# Patient Record
Sex: Male | Born: 1985 | Race: Black or African American | Hispanic: No | Marital: Single | Smoking: Current some day smoker
Health system: Southern US, Community
[De-identification: ages and names within clinical notes are randomized; demographics above are authoritative.]

## PROBLEM LIST (undated history)

## (undated) DIAGNOSIS — Z21 Asymptomatic human immunodeficiency virus [HIV] infection status: Secondary | ICD-10-CM

## (undated) DIAGNOSIS — R569 Unspecified convulsions: Secondary | ICD-10-CM

## (undated) DIAGNOSIS — B20 Human immunodeficiency virus [HIV] disease: Secondary | ICD-10-CM

## (undated) DIAGNOSIS — J45909 Unspecified asthma, uncomplicated: Secondary | ICD-10-CM

## (undated) HISTORY — PX: KNEE SURGERY: SHX244

---

## 2015-12-23 ENCOUNTER — Emergency Department (HOSPITAL_COMMUNITY)
Admission: EM | Admit: 2015-12-23 | Discharge: 2015-12-23 | Disposition: A | Discharge status: Home / Self Care | Payer: Self-pay | Attending: Emergency Medicine | Admitting: Emergency Medicine

## 2015-12-23 ENCOUNTER — Emergency Department (HOSPITAL_COMMUNITY): Payer: Self-pay

## 2015-12-23 ENCOUNTER — Encounter (HOSPITAL_COMMUNITY): Payer: Self-pay | Admitting: Emergency Medicine

## 2015-12-23 DIAGNOSIS — M62830 Muscle spasm of back: Secondary | ICD-10-CM | POA: Insufficient documentation

## 2015-12-23 DIAGNOSIS — J45909 Unspecified asthma, uncomplicated: Secondary | ICD-10-CM | POA: Insufficient documentation

## 2015-12-23 DIAGNOSIS — S46812A Strain of other muscles, fascia and tendons at shoulder and upper arm level, left arm, initial encounter: Secondary | ICD-10-CM

## 2015-12-23 DIAGNOSIS — Y998 Other external cause status: Secondary | ICD-10-CM | POA: Insufficient documentation

## 2015-12-23 DIAGNOSIS — T148XXA Other injury of unspecified body region, initial encounter: Secondary | ICD-10-CM

## 2015-12-23 DIAGNOSIS — X58XXXA Exposure to other specified factors, initial encounter: Secondary | ICD-10-CM | POA: Insufficient documentation

## 2015-12-23 DIAGNOSIS — S29012A Strain of muscle and tendon of back wall of thorax, initial encounter: Secondary | ICD-10-CM | POA: Insufficient documentation

## 2015-12-23 DIAGNOSIS — S199XXA Unspecified injury of neck, initial encounter: Secondary | ICD-10-CM | POA: Insufficient documentation

## 2015-12-23 DIAGNOSIS — Y9389 Activity, other specified: Secondary | ICD-10-CM | POA: Insufficient documentation

## 2015-12-23 DIAGNOSIS — M25512 Pain in left shoulder: Secondary | ICD-10-CM

## 2015-12-23 DIAGNOSIS — X503XXA Overexertion from repetitive movements, initial encounter: Secondary | ICD-10-CM

## 2015-12-23 DIAGNOSIS — S4992XA Unspecified injury of left shoulder and upper arm, initial encounter: Secondary | ICD-10-CM | POA: Insufficient documentation

## 2015-12-23 DIAGNOSIS — Y9289 Other specified places as the place of occurrence of the external cause: Secondary | ICD-10-CM | POA: Insufficient documentation

## 2015-12-23 HISTORY — DX: Unspecified asthma, uncomplicated: J45.909

## 2015-12-23 HISTORY — DX: Unspecified convulsions: R56.9

## 2015-12-23 MED ORDER — NAPROXEN 500 MG PO TABS
500.0000 mg | ORAL_TABLET | Freq: Two times a day (BID) | ORAL | Status: DC | PRN
Start: 2015-12-23 — End: 2016-01-05

## 2015-12-23 MED ORDER — KETOROLAC TROMETHAMINE 30 MG/ML IJ SOLN
60.0000 mg | Freq: Once | INTRAMUSCULAR | Status: AC
  Administered 2015-12-23: 60 mg via INTRAMUSCULAR
  Filled 2015-12-23: qty 2

## 2015-12-23 MED ORDER — CYCLOBENZAPRINE HCL 10 MG PO TABS: 10.0000 mg | ORAL_TABLET | Freq: Three times a day (TID) | ORAL | Status: DC | PRN

## 2015-12-23 NOTE — ED Provider Notes (Signed)
CSN: [id number]     Arrival date & time 12/23/15  0907 History   First MD Initiated Contact with Patient 12/23/15 [phone]     Chief Complaint  Patient presents with  . Shoulder Pain     (Consider location/radiation/quality/duration/timing/severity/associated sxs/prior Treatment) HPI Comments: Lee Austin is a 30 y.o. male with a PMHx of seizures and asthma, who presents to the ED with complaints of left shoulder/trapezius pain. 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 Describes gradual onset 5/10 stabbing/sharp nonradiating left trapezius/shoulder pain which is constant, worse with pushing and pulling activities, unrelieved with ibuprofen, and improved somewhat with Biofreeze. He denies any other injuries. No falls or trauma. He denies any shoulder swelling/erythema/warmth/bruising, loss of ROM, fevers, chills, CP, SOB, abd pain, N/V/D/C, hematuria, dysuria, numbness, tingling, weakness, or skin changes.   Patient is a 30 y.o. male presenting with shoulder pain. The history is provided by the patient. No language interpreter was used.  Shoulder Pain Location:  Shoulder Time since incident:  2 days Injury: no   Shoulder location:  L shoulder Pain details:    Quality:  Sharp   Radiates to:  Does not radiate   Severity:  Moderate   Onset quality:  Gradual   Duration:  2 days   Timing:  Constant   Progression:  Unchanged Chronicity:  New Dislocation: no   Relieved by: biofreeze. Worsened by:  Movement Ineffective treatments:  NSAIDs Associated symptoms: neck pain (L trapezius)   Associated symptoms: no decreased range of motion, no fever, no muscle weakness, no numbness, no swelling and no tingling     Past Medical History  Diagnosis Date  . Seizures (HCC)   . Asthma    History reviewed. No pertinent past surgical history. History reviewed.  No pertinent family history. Social History  Substance Use Topics  . Smoking status: Never Smoker   . Smokeless tobacco: None  . Alcohol Use: No    Review of Systems  Constitutional: Negative for fever and chills.  Respiratory: Negative for shortness of breath.   Cardiovascular: Negative for chest pain.  Gastrointestinal: Negative for nausea, vomiting, abdominal pain, diarrhea and constipation.  Genitourinary: Negative for dysuria and hematuria.  Musculoskeletal: Positive for arthralgias (L shoulder) and neck pain (L trapezius). Negative for myalgias and joint swelling.  Skin: Negative for color change.  Allergic/Immunologic: Negative for immunocompromised state.  Neurological: Negative for weakness and numbness.  Psychiatric/Behavioral: Negative for confusion.   10 Systems reviewed and are negative for acute change except as noted in the HPI.    Allergies  Review of patient's allergies indicates no known allergies.  Home Medications   Prior to Admission medications   Medication Sig Start Date End Date Taking? Authorizing Provider  cyclobenzaprine (FLEXERIL) 10 MG tablet Take 1 tablet (10 mg total) by mouth 3 (three) times daily as needed for muscle spasms. 12/23/15   Makiya Jeune Camprubi-Soms, PA-C  naproxen (NAPROSYN) 500 MG tablet Take 1 tablet (500 mg total) by mouth 2 (two) times daily as needed for mild pain, moderate pain or headache (TAKE WITH MEALS.). 12/23/15   Kaytie Ratcliffe Camprubi-Soms, PA-C   BP 114/71 mmHg  Pulse 73  Temp(Src) 98 F (36.7 C) (Oral)  Resp 20  SpO2 100% Physical Exam  Constitutional: He is oriented to person, place, and time. Vital signs are normal. He appears well-developed and  well-nourished.  Non-toxic appearance. No distress.  Afebrile, nontoxic, NAD  HENT:  Head: Normocephalic and atraumatic.  Mouth/Throat: Mucous membranes are normal.  Eyes: Conjunctivae and EOM are normal. Right eye exhibits no discharge. Left eye exhibits no discharge.  Neck:  Normal range of motion. Neck supple. Muscular tenderness present. No spinous process tenderness present. No rigidity. Normal range of motion present.    FROM intact without spinous process TTP, no bony stepoffs or deformities, with mild L sided paraspinous muscle/trapezius TTP and muscle spasms. No rigidity or meningeal signs. No bruising or swelling.   Cardiovascular: Normal rate and intact distal pulses.   Pulmonary/Chest: Effort normal. No respiratory distress.  Abdominal: Normal appearance. He exhibits no distension.  Musculoskeletal: Normal range of motion.       Left shoulder: He exhibits tenderness and spasm. He exhibits normal range of motion, no bony tenderness, no swelling, no crepitus, no deformity, normal pulse and normal strength.       Arms: L shoulder with FROM intact, no bony TTP, with mild L sided parascapular muscle TTP and spasms, no swelling/effusion, no bruising or warmth, no crepitus/deformity, negative apley scratch, neg pain with resisted int/ext rotation, neg empty can test. Strength and sensation grossly intact in all extremities, distal pulses intact.    Neurological: He is alert and oriented to person, place, and time. He has normal strength. No sensory deficit.  Skin: Skin is warm, dry and intact. No rash noted.  Psychiatric: He has a normal mood and affect.  Nursing note and vitals reviewed.   ED Course  Procedures (including critical care time) Labs Review Labs Reviewed - No data to display  Imaging Review No results found. I have personally reviewed and evaluated these images and lab results as part of my medical decision-making.   EKG Interpretation None      MDM   Final diagnoses:  Trapezius strain, left, initial encounter  Muscle spasm of back  Repetitive motion injury  Left shoulder pain    30 y.o. male here with repetitive motion injury and L trapezius strain. NVI with soft compartments, ROM preserved. No bony tenderness, doubt need for  imaging, therefore this was cancelled. Will give toradol here. Will send home with flexeril and naprosyn. Discussed use of heat. F/up with [facility] in 1wk to establish care and recheck. I explained the diagnosis and have given explicit precautions to return to the ER including for any other new or worsening symptoms. The patient understands and accepts the medical plan as it's been dictated and I have answered their questions. Discharge instructions concerning home care and prescriptions have been given. The patient is STABLE and is discharged to home in good condition.  BP 114/71 mmHg  Pulse 73  Temp(Src) 98 F (36.7 C) (Oral)  Resp 20  SpO2 100%  Meds ordered this encounter  Medications  . ketorolac (TORADOL) 30 MG/ML injection 60 mg    Sig:   . cyclobenzaprine (FLEXERIL) 10 MG tablet    Sig: Take 1 tablet (10 mg total) by mouth 3 (three) times daily as needed for muscle spasms.    Dispense:  15 tablet    Refill:  0    Order Specific Question:  Supervising Provider    Answer:  MILLER, BRIAN [[id number]]  . naproxen (NAPROSYN) 500 MG tablet    Sig: Take 1 tablet (500 mg total) by mouth 2 (two) times daily as needed for mild pain, moderate pain or headache (TAKE WITH MEALS.).    Dispense:  20  tablet    Refill:  0    Order Specific Question:  Supervising Provider    Answer:  Eber Hong [address] Camprubi-Soms, PA-C 12/23/15 [address] Bonneau, PA-C 12/23/15 1009  Pricilla Loveless, MD 12/25/15 [id number]

## 2015-12-23 NOTE — Discharge Instructions (Signed)
Perform gentle range of motion exercises daily to keep your shoulder moving. Use heat to your shoulder throughout the day, using a heat pack for 20 minutes at a time every hour. Alternate between naprosyn and tylenol for pain relief. Use flexeril as needed for muscle spasms. Do not drive or operate machinery with muscle relaxant use. Follow up with [facility] in 1 week for recheck and to establish care. Return to the ER for changes or worsening symptoms.    Shoulder Pain The shoulder is the joint that connects your arms to your body. The bones that form the shoulder joint include the upper arm bone (humerus), the shoulder blade (scapula), and the collarbone (clavicle). The top of the humerus is shaped like a ball and fits into a rather flat socket on the scapula (glenoid cavity). A combination of muscles and strong, fibrous tissues that connect muscles to bones (tendons) support your shoulder joint and hold the ball in the socket. Small, fluid-filled sacs (bursae) are located in different areas of the joint. They act as cushions between the bones and the overlying soft tissues and help reduce friction between the gliding tendons and the bone as you move your arm. Your shoulder joint allows a wide range of motion in your arm. This range of motion allows you to do things like scratch your back or throw a ball. However, this range of motion also makes your shoulder more prone to pain from overuse and injury. Causes of shoulder pain can originate from both injury and overuse and usually can be grouped in the following four categories: 1. Redness, swelling, and pain (inflammation) of the tendon (tendinitis) or the bursae (bursitis). 2. Instability, such as a dislocation of the joint. 3. Inflammation of the joint (arthritis). 4. Broken bone (fracture). HOME CARE INSTRUCTIONS  1. Apply ice to the sore area.  Put ice in a plastic bag.  Place a towel between your skin and the bag.  Leave the  ice on for 15-20 minutes, 3-4 times per day for the first 2 days, or as directed by your health care provider. 2. Stop using cold packs if they do not help with the pain. 3. If you have a shoulder sling or immobilizer, wear it as long as your caregiver instructs. Only remove it to shower or bathe. Move your arm as little as possible, but keep your hand moving to prevent swelling. 4. Squeeze a soft ball or foam pad as much as possible to help prevent swelling. 5. Only take over-the-counter or prescription medicines for pain, discomfort, or fever as directed by your caregiver. SEEK MEDICAL CARE IF:  1. Your shoulder pain increases, or new pain develops in your arm, hand, or fingers. 2. Your hand or fingers become cold and numb. 3. Your pain is not relieved with medicines. SEEK IMMEDIATE MEDICAL CARE IF:  1. Your arm, hand, or fingers are numb or tingling. 2. Your arm, hand, or fingers are significantly swollen or turn white or blue. MAKE SURE YOU:  1. Understand these instructions. 2. Will watch your condition. 3. Will get help right away if you are not doing well or get worse.   This information is not intended to replace advice given to you by your health care provider. Make sure you discuss any questions you have with your health care provider.   Document Released: 08/27/2005 Document Revised: 12/08/2014 Document Reviewed: 03/12/2015 Elsevier Interactive Patient Education 2016 Else[employer] Inc[employer]  Muscle Strain A muscle strain (pulled muscle) happens when  a muscle is stretched beyond normal length. It happens when a sudden, violent force stretches your muscle too far. Usually, a few of the fibers in your muscle are torn. Muscle strain is common in athletes. Recovery usually takes 1-2 weeks. Complete healing takes 5-6 weeks.  HOME CARE  5. Follow the PRICE method of treatment to help your injury get better. Do this the first 2-3 days after the injury: 1. Protect. Protect the muscle to keep it  from getting injured again. 2. Rest. Limit your activity and rest the injured body part. 3. Ice. Put ice in a plastic bag. Place a towel between your skin and the bag. Then, apply the ice and leave it on from 15-20 minutes each hour. After the third day, switch to moist heat packs. 4. Compression. Use a splint or elastic bandage on the injured area for comfort. Do not put it on too tightly. 5. Elevate. Keep the injured body part above the level of your heart. 6. Only take medicine as told by your doctor. 7. Warm up before doing exercise to prevent future muscle strains. GET HELP IF:  6. You have more pain or puffiness (swelling) in the injured area. 7. You feel numbness, tingling, or notice a loss of strength in the injured area. MAKE SURE YOU:  4. Understand these instructions. 5. Will watch your condition. 6. Will get help right away if you are not doing well or get worse.   This information is not intended to replace advice given to you by your health care provider. Make sure you discuss any questions you have with your health care provider.   Document Released: 08/26/2008 Document Revised: 09/07/2013 Document Reviewed: 06/16/2013 Elsevier Interactive Patient Education 2016 [employer]  Muscle Cramps and Spasms Muscle cramps and spasms are when muscles tighten by themselves. They usually get better within minutes. Muscle cramps are painful. They are usually stronger and last longer than muscle spasms. Muscle spasms may or may not be painful. They can last a few seconds or much longer. HOME CARE 8. Drink enough fluid to keep your pee (urine) clear or pale yellow. 9. Massage, stretch, and relax the muscle. 10. Use a warm towel, heating pad, or warm shower water on tight muscles. 11. Place ice on the muscle if it is tender or in pain. 1. Put ice in a plastic bag. 2. Place a towel between your skin and the bag. 3. Leave the ice on for 15-20 minutes, 03-04 times a day. 12. Only take  medicine as told by your doctor. GET HELP RIGHT AWAY IF:  Your cramps or spasms get worse, happen more often, or do not get better with time. MAKE SURE YOU: 8. Understand these instructions. 9. Will watch your condition. 10. Will get help right away if you are not doing well or get worse.   This information is not intended to replace advice given to you by your health care provider. Make sure you discuss any questions you have with your health care provider.   Document Released: 10/30/2008 Document Revised: 03/14/2013 Document Reviewed: 11/03/2012 Elsevier Interactive Patient Education 2016 Elsevier Inc.  [employer] Therapy Heat therapy can help ease sore, stiff, injured, and tight muscles and joints. Heat relaxes your muscles, which may help ease your pain.  RISKS AND COMPLICATIONS If you have any of the following conditions, do not use heat therapy unless your health care provider has approved: 13. Poor circulation. 14. Healing wounds or scarred skin in the area being treated. 15.  Diabetes, heart disease, or high blood pressure. 16. Not being able to feel (numbness) the area being treated. 17. Unusual swelling of the area being treated. 18. Active infections. 19. Blood clots. 20. Cancer. 21. Inability to communicate pain. This may include young children and people who have problems with their brain function (dementia). 22. Pregnancy. Heat therapy should only be used on old, pre-existing, or long-lasting (chronic) injuries. Do not use heat therapy on new injuries unless directed by your health care provider. HOW TO USE HEAT THERAPY There are several different kinds of heat therapy, including: 11. Moist heat pack. 12. Warm water bath. 13. Hot water bottle. 14. Electric heating pad. 15. Heated gel pack. 16. Heated wrap. 17. Electric heating pad. Use the heat therapy method suggested by your health care provider. Follow your health care provider's instructions on when and how to use  heat therapy. GENERAL HEAT THERAPY RECOMMENDATIONS 7. Do not sleep while using heat therapy. Only use heat therapy while you are awake. 8. Your skin may turn pink while using heat therapy. Do not use heat therapy if your skin turns red. 9. Do not use heat therapy if you have new pain. 10. High heat or long exposure to heat can cause burns. Be careful when using heat therapy to avoid burning your skin. 11. Do not use heat therapy on areas of your skin that are already irritated, such as with a rash or sunburn. SEEK MEDICAL CARE IF: 3. You have blisters, redness, swelling, or numbness. 4. You have new pain. 5. Your pain is worse. MAKE SURE YOU: 4. Understand these instructions. 5. Will watch your condition. 6. Will get help right away if you are not doing well or get worse.   This information is not intended to replace advice given to you by your health care provider. Make sure you discuss any questions you have with your health care provider.   Document Released: 02/09/2012 Document Revised: 12/08/2014 Document Reviewed: 01/10/2014 Elsevier Interactive Patient Education 2016 Elsevier Inc.  Shoulder Range of Motion Exercises Shoulder range of motion (ROM) exercises are designed to keep the shoulder moving freely. They are often recommended for people who have shoulder pain. MOVEMENT EXERCISE When you are able, do this exercise 5-6 days per week, or as told by your health care provider. Work toward doing 2 sets of 10 swings. Pendulum Exercise How To Do This Exercise Lying Down 23. Lie face-down on a bed with your abdomen close to the side of the bed. 24. Let your arm hang over the side of the bed. 25. Relax your shoulder, arm, and hand. 26. Slowly and gently swing your arm forward and back. Do not use your neck muscles to swing your arm. They should be relaxed. If you are struggling to swing your arm, have someone gently swing it for you. When you do this exercise for the first time,  swing your arm at a 15 degree angle for 15 seconds, or swing your arm 10 times. As pain lessens over time, increase the angle of the swing to 30-45 degrees. 27. Repeat steps 1-4 with the other arm. How To Do This Exercise While Standing 18. Stand next to a sturdy chair or table and hold on to it with your hand.  Bend forward at the waist.  Bend your knees slightly.  Relax your other arm and let it hang limp.  Relax the shoulder blade of the arm that is hanging and let it drop.  While keeping your shoulder relaxed,  use body motion to swing your arm in small circles. The first time you do this exercise, swing your arm for about 30 seconds or 10 times. When you do it next time, swing your arm for a little longer.  Stand up tall and relax.  Repeat steps 1-7, this time changing the direction of the circles. 19. Repeat steps 1-8 with the other arm. STRETCHING EXERCISES Do these exercises 3-4 times per day on 5-6 days per week or as told by your health care provider. Work toward holding the stretch for 20 seconds. Stretching Exercise 1 12. Lift your arm straight out in front of you. 13. Bend your arm 90 degrees at the elbow (right angle) so your forearm goes across your body and looks like the letter "L." 14. Use your other arm to gently pull the elbow forward and across your body. 15. Repeat steps 1-3 with the other arm. Stretching Exercise 2 You will need a towel or rope for this exercise. 6. Bend one arm behind your back with the palm facing outward. 7. Hold a towel with your other hand. 8. Reach the arm that holds the towel above your head, and bend that arm at the elbow. Your wrist should be behind your neck. 9. Use your free hand to grab the free end of the towel. 10. With the higher hand, gently pull the towel up behind you. 11. With the lower hand, pull the towel down behind you. 12. Repeat steps 1-6 with the other arm. STRENGTHENING EXERCISES Do each of these exercises at four  different times of day (sessions) every day or as told by your health care provider. To begin with, repeat each exercise 5 times (repetitions). Work toward doing 3 sets of 12 repetitions or as told by your health care provider. Strengthening Exercise 1 You will need a light weight for this activity. As you grow stronger, you may use a heavier weight. 7. Standing with a weight in your hand, lift your arm straight out to the side until it is at the same height as your shoulder. 8. Bend your arm at 90 degrees so that your fingers are pointing to the ceiling. 9. Slowly raise your hand until your arm is straight up in the air. 10. Repeat steps 1-3 with the other arm. Strengthening Exercise 2 You will need a light weight for this activity. As you grow stronger, you may use a heavier weight. 1. Standing with a weight in your hand, gradually move your straight arm in an arc, starting at your side, then out in front of you, then straight up over your head. 2. Gradually move your other arm in an arc, starting at your side, then out in front of you, then straight up over your head. 3. Repeat steps 1-2 with the other arm. Strengthening Exercise 3 You will need an elastic band for this activity. As you grow stronger, gradually increase the size of the bands or increase the number of bands that you use at one time. 1. While standing, hold an elastic band in one hand and raise that arm up in the air. 2. With your other hand, pull down the band until that hand is by your side. 3. Repeat steps 1-2 with the other arm.   This information is not intended to replace advice given to you by your health care provider. Make sure you discuss any questions you have with your health care provider.   Document Released: 08/16/2003 Document Revised: 04/03/2015 Document Reviewed: 11/13/2014  Elsevier Interactive Patient Education ©2016 Elsevier Inc. ° °

## 2015-12-23 NOTE — ED Notes (Signed)
Pt noticed L shoulder pain that radiates to elbow and also L shoulder blade pain on Friday. 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

## 2015-12-31 ENCOUNTER — Emergency Department (HOSPITAL_COMMUNITY)
Admission: EM | Admit: 2015-12-31 | Discharge: 2015-12-31 | Disposition: A | Discharge status: Home / Self Care | Payer: Self-pay | Attending: Emergency Medicine | Admitting: Emergency Medicine

## 2015-12-31 ENCOUNTER — Encounter (HOSPITAL_COMMUNITY): Payer: Self-pay | Admitting: Emergency Medicine

## 2015-12-31 DIAGNOSIS — J45909 Unspecified asthma, uncomplicated: Secondary | ICD-10-CM | POA: Insufficient documentation

## 2015-12-31 DIAGNOSIS — M25512 Pain in left shoulder: Secondary | ICD-10-CM | POA: Insufficient documentation

## 2015-12-31 DIAGNOSIS — M542 Cervicalgia: Secondary | ICD-10-CM | POA: Insufficient documentation

## 2015-12-31 NOTE — ED Notes (Signed)
Pt stable, ambulatory, states understanding of discharge instructions 

## 2015-12-31 NOTE — ED Provider Notes (Signed)
CSN: [id number]     Arrival date & time 12/31/15  1843 History  By signing my name below, I, Soijett Blue, attest that this documentation has been prepared under the direction and in the presence of Alveta Heimlich, PA-C Electronically Signed: Soijett Blue, ED Scribe. 12/31/2015. 7:53 PM.   Chief Complaint  Patient presents with  . Shoulder Pain   Patient is a 30 y.o. male presenting with shoulder pain. The history is provided by the patient. No language interpreter was used.  Shoulder Pain Location:  Shoulder Time since incident:  2 weeks Injury: no   Shoulder location:  L shoulder Pain details:    Quality:  Sharp   Radiates to:  Does not radiate   Severity:  Moderate   Onset quality:  Gradual   Timing:  Intermittent   Progression:  Unchanged Relieved by:  Being still Worsened by:  Bearing weight Ineffective treatments:  Muscle relaxant and NSAIDs Associated symptoms: no decreased range of motion, no muscle weakness, no neck pain, no numbness, no swelling and no tingling    Lee Austin is a 30 y.o. male with a medical hx of seizures who presents to the Emergency Department complaining of sharp left shoulder pain onset 2 weeks ago. He notes that he was seen at [facility] when his symptoms began and Rx medications and referred to [facility]. He has not re-injured the extremity since. PT called [facility] to try and set up an appointment and he was informed that they were not accepting any new patients at this time and was told to return to ED. He reports that the naprosyn and flexeril has not alleviated his symptoms. He is requesting a referral to another doctor who he can see soon. He states that he used his friend muscle relaxer Rx to aid in the relief of his symptoms. Pt left shoulder pain is worsened with laying on it and lifting objects. He states that lifting heavy objects causes shooting pains in his shoulder. The pain does not radiate. He states that when he is  at rest the pain is relieved. He states he has taken the week off work and does not feel that he is able to return yet. He denies color change, wound, rash, swelling, weakness, loss of ROM, numbness and any other symptoms.   Per pt chart review: Pt was seen in the ED on 12/23/2015 for left shoulder pain. Pt was given a toradol injection while in the ED and Rx flexeril and naprosyn. Pt was informed to follow up with [facility] to establish care and for a recheck of his symptoms.   Past Medical History  Diagnosis Date  . Seizures (HCC)   . Asthma    History reviewed. No pertinent past surgical history. History reviewed. No pertinent family history. Social History  Substance Use Topics  . Smoking status: Never Smoker   . Smokeless tobacco: None  . Alcohol Use: No    Review of Systems  Musculoskeletal: Positive for arthralgias. Negative for neck pain.  Skin: Negative for color change, rash and wound.  All other systems reviewed and are negative.     Allergies  Review of patient's allergies indicates no known allergies.  Home Medications   Prior to Admission medications   Medication Sig Start Date End Date Taking? Authorizing Provider  cyclobenzaprine (FLEXERIL) 10 MG tablet Take 1 tablet (10 mg total) by mouth 3 (three) times daily as needed for muscle spasms. 12/23/15  Mercedes Camprubi-Soms, PA-C  naproxen (NAPROSYN) 500 MG tablet Take 1 tablet (500 mg total) by mouth 2 (two) times daily as needed for mild pain, moderate pain or headache (TAKE WITH MEALS.). 12/23/15   Mercedes Camprubi-Soms, PA-C   BP 122/60 mmHg  Pulse 59  Temp(Src) 97.9 F (36.6 C) (Oral)  Resp 18  SpO2 98% Physical Exam  Constitutional: He appears well-developed and well-nourished. No distress.  HENT:  Head: Normocephalic and atraumatic.  Eyes: Conjunctivae are normal. Right eye exhibits no discharge. Left eye exhibits no discharge. No scleral icterus.  Neck: Normal range of motion.  Muscular tenderness present. No spinous process tenderness present. No rigidity.    Mild tenderness over left superior trapezius. No spasm present. FROM of neck intact. No rigidity.   Cardiovascular: Normal rate, regular rhythm and intact distal pulses.   Radial pulse palpable. Cap refill < 3 seconds  Pulmonary/Chest: Effort normal. No respiratory distress.  Musculoskeletal: Normal range of motion.       Left shoulder: He exhibits tenderness. He exhibits normal range of motion, no swelling, no deformity and no spasm.       Arms: Generalized tenderness over left trapezius as noted in diagram. FROM of shoulder and thoracic spine. No bony tenderness.   Neurological: He is alert. Coordination normal.  5/5 strength in BUE. Sensation to light touch intact throughout.   Skin: Skin is warm and dry.  Psychiatric: He has a normal mood and affect. His behavior is normal.  Nursing note and vitals reviewed.    ED Course  Procedures (including critical care time) DIAGNOSTIC STUDIES: Oxygen Saturation is 98% on RA, nl by my interpretation.    COORDINATION OF CARE: 7:51 PM Discussed treatment plan with pt at bedside which includes referral and follow up with orthopedist and pt agreed to plan.    Labs Review Labs Reviewed - No data to display  Imaging Review No results found.    EKG Interpretation None      MDM   Final diagnoses:  Left shoulder pain   Patient presenting with left shoulder pain x 2 weeks. Left arm is neurovascularly intact with FROM. Patient requesting referral to new PCP for further evaluation of shoulder pain and work note. Discussed RICE therapy and use of OTC pain relievers. Pt advised to follow up with orthopedics if symptoms persist. Given referral information in resource guide. Return precautions discussed at bedside and given in discharge paperwork. Pt is stable for discharge.  I personally performed the services described in this documentation, which was  scribed in my presence. The recorded information has been reviewed and is accurate.    Alveta Heimlich, PA-C 12/31/15 2015  Eber Hong, MD 01/02/16 [phone]

## 2015-12-31 NOTE — Discharge Instructions (Signed)
Shoulder Pain  The shoulder is the joint that connects your arms to your body. The bones that form the shoulder joint include the upper arm bone (humerus), the shoulder blade (scapula), and the collarbone (clavicle). The top of the humerus is shaped like a ball and fits into a rather flat socket on the scapula (glenoid cavity). A combination of muscles and strong, fibrous tissues that connect muscles to bones (tendons) support your shoulder joint and hold the ball in the socket. Small, fluid-filled sacs (bursae) are located in different areas of the joint. They act as cushions between the bones and the overlying soft tissues and help reduce friction between the gliding tendons and the bone as you move your arm. Your shoulder joint allows a wide range of motion in your arm. This range of motion allows you to do things like scratch your back or throw a ball. However, this range of motion also makes your shoulder more prone to pain from overuse and injury.  Causes of shoulder pain can originate from both injury and overuse and usually can be grouped in the following four categories:  1. Redness, swelling, and pain (inflammation) of the tendon (tendinitis) or the bursae (bursitis).  2. Instability, such as a dislocation of the joint.  3. Inflammation of the joint (arthritis).  4. Broken bone (fracture).  HOME CARE INSTRUCTIONS   1. Apply ice to the sore area.  ¨ Put ice in a plastic bag.  ¨ Place a towel between your skin and the bag.  ¨ Leave the ice on for 15-20 minutes, 3-4 times per day for the first 2 days, or as directed by your health care provider.  2. Stop using cold packs if they do not help with the pain.  3. If you have a shoulder sling or immobilizer, wear it as long as your caregiver instructs. Only remove it to shower or bathe. Move your arm as little as possible, but keep your hand moving to prevent swelling.  4. Squeeze a soft ball or foam pad as much as possible to help prevent swelling.  5. Only take  over-the-counter or prescription medicines for pain, discomfort, or fever as directed by your caregiver.  SEEK MEDICAL CARE IF:   1. Your shoulder pain increases, or new pain develops in your arm, hand, or fingers.  2. Your hand or fingers become cold and numb.  3. Your pain is not relieved with medicines.  SEEK IMMEDIATE MEDICAL CARE IF:   1. Your arm, hand, or fingers are numb or tingling.  2. Your arm, hand, or fingers are significantly swollen or turn white or blue.  MAKE SURE YOU:   1. Understand these instructions.  2. Will watch your condition.  3. Will get help right away if you are not doing well or get worse.     This information is not intended to replace advice given to you by your health care provider. Make sure you discuss any questions you have with your health care provider.     Document Released: 08/27/2005 Document Revised: 12/08/2014 Document Reviewed: 03/12/2015  Elsevier Interactive Patient Education ©2016 Elsevier Inc.  Shoulder Range of Motion Exercises  Shoulder range of motion (ROM) exercises are designed to keep the shoulder moving freely. They are often recommended for people who have shoulder pain.  MOVEMENT EXERCISE  When you are able, do this exercise 5-6 days per week, or as told by your health care provider. Work toward doing 2 sets of 10 swings.  Pendulum   Exercise  How To Do This Exercise Lying Down  5. Lie face-down on a bed with your abdomen close to the side of the bed.  6. Let your arm hang over the side of the bed.  7. Relax your shoulder, arm, and hand.  8. Slowly and gently swing your arm forward and back. Do not use your neck muscles to swing your arm. They should be relaxed. If you are struggling to swing your arm, have someone gently swing it for you. When you do this exercise for the first time, swing your arm at a 15 degree angle for 15 seconds, or swing your arm 10 times. As pain lessens over time, increase the angle of the swing to 30-45 degrees.  9. Repeat steps 1-4  with the other arm.  How To Do This Exercise While Standing  6. Stand next to a sturdy chair or table and hold on to it with your hand.  ¨ Bend forward at the waist.  ¨ Bend your knees slightly.  ¨ Relax your other arm and let it hang limp.  ¨ Relax the shoulder blade of the arm that is hanging and let it drop.  ¨ While keeping your shoulder relaxed, use body motion to swing your arm in small circles. The first time you do this exercise, swing your arm for about 30 seconds or 10 times. When you do it next time, swing your arm for a little longer.  ¨ Stand up tall and relax.  ¨ Repeat steps 1-7, this time changing the direction of the circles.  7. Repeat steps 1-8 with the other arm.  STRETCHING EXERCISES  Do these exercises 3-4 times per day on 5-6 days per week or as told by your health care provider. Work toward holding the stretch for 20 seconds.  Stretching Exercise 1  4. Lift your arm straight out in front of you.  5. Bend your arm 90 degrees at the elbow (right angle) so your forearm goes across your body and looks like the letter "L."  6. Use your other arm to gently pull the elbow forward and across your body.  7. Repeat steps 1-3 with the other arm.  Stretching Exercise 2  You will need a towel or rope for this exercise.  3. Bend one arm behind your back with the palm facing outward.  4. Hold a towel with your other hand.  5. Reach the arm that holds the towel above your head, and bend that arm at the elbow. Your wrist should be behind your neck.  6. Use your free hand to grab the free end of the towel.  7. With the higher hand, gently pull the towel up behind you.  8. With the lower hand, pull the towel down behind you.  9. Repeat steps 1-6 with the other arm.  STRENGTHENING EXERCISES  Do each of these exercises at four different times of day (sessions) every day or as told by your health care provider. To begin with, repeat each exercise 5 times (repetitions). Work toward doing 3 sets of 12 repetitions or  as told by your health care provider.  Strengthening Exercise 1  You will need a light weight for this activity. As you grow stronger, you may use a heavier weight.  4. Standing with a weight in your hand, lift your arm straight out to the side until it is at the same height as your shoulder.  5. Bend your arm at 90 degrees so that your   front of you, then straight up over your head. 2. Gradually move your other arm in an arc, starting at your side, then out in front of you, then straight up over your head. 3. Repeat steps 1-2 with the other arm. Strengthening Exercise 3 You will need an elastic band for this activity. As you grow stronger, gradually increase the size of the bands or increase the number of bands that you use at one time. 1. While standing, hold an elastic band in one hand and raise that arm up in the air. 2. With your other hand, pull down the band until that hand is by your side. 3. Repeat steps 1-2 with the other arm.   This information is not intended to replace advice given to you by your health care provider. Make sure you discuss any questions you have with your health care provider.   Document Released: 08/16/2003 Document Revised: 04/03/2015 Document Reviewed: 11/13/2014 Elsevier Interactive Patient Education 2016 [employer].   Emergency Department Resource Guide 1) Find a Doctor and Pay Out of Pocket Although you won't have to find out who is covered by your insurance plan, it is a good idea to ask around and get recommendations. You will then need to call the office and see if the  doctor you have chosen will accept you as a new patient and what types of options they offer for patients who are self-pay. Some doctors offer discounts or will set up payment plans for their patients who do not have insurance, but you will need to ask so you aren't surprised when you get to your appointment.  2) Contact Your Local Health Department Not all health departments have doctors that can see patients for sick visits, but many do, so it is worth a call to see if yours does. If you don't know where your local health department is, you can check in your phone book. The CDC also has a tool to help you locate your state's health department, and many state websites also have listings of all of their local health departments.  3) Find a Walk-in Clinic If your illness is not likely to be very severe or complicated, you may want to try a walk in clinic. These are popping up all over the country in pharmacies, drugstores, and shopping centers. They're usually staffed by nurse practitioners or physician assistants that have been trained to treat common illnesses and complaints. They're usually fairly quick and inexpensive. However, if you have serious medical issues or chronic medical problems, these are probably not your best option.  No Primary Care Doctor: - Call Health Connect at  [phone] - they can help you locate a primary care doctor that  accepts your insurance, provides certain services, etc. - Physician Referral Service- [phone]  Chronic Pain Problems: Organization         Address  Phone   Notes  [facility]  [phone] Patients need to be referred by their primary care doctor.   Medication Assistance: Organization         Address  Phone   Notes  [facility] Medication [facility] [address] [place], [place] [address] [phone] --Must be a resident of [facility] -- Must have NO insurance coverage whatsoever (no Medicaid/  Medicare, etc.) -- The pt. MUST have a primary care doctor that directs their care regularly and follows them in the community   [facility]  [phone]   [address]  (  [phone]    Agencies that provide inexpensive medical care: Organization         Address  Phone   Notes  [facility]  [phone]   [facility]    [phone]   [facility] [address] [place], [place] [address] [phone]   [facility] [address], [place] [phone]   Planned Parenthood    [phone]   [facility]    [phone]   [facility]  [address], [place] Phone:  [phone], Fax:  [phone] Hours of Operation:  9 am - 6 pm, M-F.  Also accepts Medicaid/Medicare and self-pay.  [facility]  [address], [place] Phone: [phone], Fax: [phone]. Hours of Operation:  8:30 am - 5:30 pm, M-F.  Also accepts Medicaid and self-pay.  [facility] [place] [address] [place] Phone: [phone]   [facility] [address] Natasha Bence [place], [place] [phone], Ext. [phone] Mondays & Thursdays: 7-9 AM.  First 15 patients are seen on a first come, first serve basis.    Medicaid-accepting [facility] Providers:  Organization         Address  Phone   Notes  [facility] [address], [place] [phone] Also accepts self-pay patients.  [facility] [address] Laurell Josephs [place], [place]  [phone]   [facility] [address], [place] [phone]   [facility] [address], [place] [phone]   Renaye Rakers [address], [place]   [phone] Only accepts [place] Access [place] patients after they have their name applied to their card.    Self-Pay (no insurance) in [facility]:  Organization         Address  Phone   Notes  Sickle Cell Patients, [facility] [address] [place], [place] [phone]   [facility] [address] [place], [place] [phone]   [facility]  [address], [place] [phone]   [facility]/Dr. Osei-Bonsu  [address], [place] or [address], [place] [phone] Phone number for both [place] and [place] locations is the same.  Urgent Medical and [facility] [address] [place] [phone]   [facility] [address] [place] or [address] [phone]   [facility] [address] [place] [phone], phone; [phone], fax Sees patients 1st and 3rd Saturday of every month.  Must not qualify for public or private insurance (i.e. Medicaid, Medicare, [place] Health Choice, Veterans' Benefits)  Household income should be no more than 200% of the poverty level The clinic cannot treat you if you are pregnant or think you are pregnant  Sexually transmitted diseases are not treated at the clinic.    Dental Care: Organization         Address  Phone  Notes  [facility] Department of [facility] [address] [place], [place] [phone] Accepts children up to age 43 who are enrolled in [place] or [place] Health Choice; pregnant women with a Medicaid card; and children who have  applied for Medicaid or [place] Health Choice, but were declined, whose parents can pay a reduced fee at time of service.  [facility]  [address], [place] [phone] Accepts children up to age 61 who are enrolled in [place] or [place] Health Choice; pregnant women with a Medicaid card; and children who have applied for Medicaid or  Health Choice,  but were declined, whose parents can pay a reduced fee at time of service.  [facility]  [address] [place], [place] [phone] Patients are seen by appointment only. Walk-ins are not accepted. [facility] will see patients 19 years of age and older. Monday - Tuesday (8am-5pm) Most Wednesdays (8:30-5pm) $30 per visit, cash only  [facility] Adult Dental Access [facility]  [address], [facility] [phone] Patients are seen by appointment only. Walk-ins are not accepted. [facility] will see patients 63 years of age and older. One Wednesday Evening (Monthly: Volunteer Based).  $30 per visit, cash only  [employer]  [phone] for adults; Children under age 76, call Graduate Pediatric Dentistry at [phone]. Children aged 15-14, please call [phone] to request a pediatric application.  Dental services are provided in all areas of dental care including fillings, crowns and bridges, complete and partial dentures, implants, gum treatment, root canals, and extractions. Preventive care is also provided. Treatment is provided to both adults and children. Patients are selected via a lottery and there is often a waiting list.   [facility] [address], [place]  [phone] [email or web address]   [facility] [address] [place], [place] [phone], Ext. [phone] Second and Fourth Thursday of each month, opens at 6:30 AM; Clinic ends at 9 AM.  Patients are seen on a first-come first-served basis, and a limited number are seen during each clinic.   [facility]  [address] Ether Griffins [place], [place] [phone]   Eligibility Requirements You must have lived in [place], [place], or [place] counties for at least the last three months.   You cannot be eligible for state or federal sponsored [place], including [employer], [place], or [employer].   You generally  cannot be eligible for healthcare insurance through your employer.    How to apply: Eligibility screenings are held every Tuesday and Wednesday afternoon from 1:00 pm until 4:00 pm. You do not need an appointment for the interview!  [facility] [address] [place], [place] [phone]   [facility] Health Department  [phone]   [facility] Health Department  [phone]   [facility] Health Department  [phone]    Behavioral Health Resources in the Community: Intensive Outpatient Programs Organization         Address  Phone  Notes  [facility] [address] [place], [place] [phone]   [facility] Outpatient [address] [place], [place] [phone]   [facility] [address] [place], [place]  [phone]   [facility] [address],  [place], [place] [phone] or [phone]   Substance Abuse Resources Organization         Address  Phone  Notes  [facility]  [phone]   [facility]  [phone]   [facility]  [phone]   Floydene Flock  [phone]   Residential & Outpatient Substance  Abuse Program  [phone]   Psychological Services Organization         Address  Phone  Notes  [facility] Behavioral Health  [phone]   [facility]  [phone]   [facility] Mental Health [phone], [place] [phone] or [phone]    Mobile Crisis Teams Organization         Address  Phone  Notes  [facility], Mobile Crisis Care Unit  [phone]   [facility]  [address] [place], [place] [phone]   Doristine Locks [address] [place] [phone]    Self-Help/Support Groups Organization         Address  Phone             Notes  [facility] [place] - variety of support groups  336- I7437963 Call for more  information  Narcotics Anonymous (NA), [facility] [address], [employer] [place]  2 meetings at this location   Statistician         Address  Phone  Notes  [facility] [address],    [place]  [phone]   [facility]  [address] [place] [address], [place], [place] [phone]   [facility] [address] [place], [place] [phone] Admissions: 8am-3pm M-F  [facility] [address],    [place], [place] [phone]   [facility] [address] [place], [place], [place] [phone]   [facility] [address],  [place], [place] [phone]   [facility] - Intensive Outpatient [address] Laurell Joseph[address], [place], [place] [phone]   [facility] (Addiction Recovery [facility] [address] [place]  [place], [place] [phone] or [phone]   [facility] ([facility]) [address], [place], [place] [phone] Accepts Medicaid  [facility] [place] [place] [address],  [place] [phone] Substance Abuse/Addiction Treatment   [facility] Organization         Address  Phone  Notes  [facility]  [phone]   Angie Fava, PhD [address] Ervin Kn[place], [place]   [phone] or [phone]   [facility] Behavioral   [address] [place], [place] [phone]   [facility] [address] [place], [place] [phone] Insurance/Medicaid/sponsorship through [facility] and Families [address]                                    [place], [place] [phone] Therapy/tele-psych/case  [facility] [address], [place] [phone]    Dr. Lolly Mustache  [phone]   [facility]  [employer] [facility] Dept. 1) [address] [place] 2) [address], [place] 3)  [address], [place] [phone]  [phone]   [facility] Child Abuse Hotline [phone] or 6390451824 (After Hours)

## 2015-12-31 NOTE — ED Notes (Signed)
Pt sts left arm and shoulder pain x several weeks; pt seen at [facility] for same but still having pain

## 2016-01-05 ENCOUNTER — Emergency Department (HOSPITAL_COMMUNITY)
Admission: EM | Admit: 2016-01-05 | Discharge: 2016-01-05 | Disposition: A | Discharge status: Home / Self Care | Payer: Self-pay | Attending: Emergency Medicine | Admitting: Emergency Medicine

## 2016-01-05 ENCOUNTER — Encounter (HOSPITAL_COMMUNITY): Payer: Self-pay | Admitting: Emergency Medicine

## 2016-01-05 DIAGNOSIS — M791 Myalgia, unspecified site: Secondary | ICD-10-CM

## 2016-01-05 DIAGNOSIS — M546 Pain in thoracic spine: Secondary | ICD-10-CM | POA: Insufficient documentation

## 2016-01-05 DIAGNOSIS — M79602 Pain in left arm: Secondary | ICD-10-CM | POA: Insufficient documentation

## 2016-01-05 DIAGNOSIS — J45909 Unspecified asthma, uncomplicated: Secondary | ICD-10-CM | POA: Insufficient documentation

## 2016-01-05 DIAGNOSIS — Z79899 Other long term (current) drug therapy: Secondary | ICD-10-CM | POA: Insufficient documentation

## 2016-01-05 DIAGNOSIS — Z7951 Long term (current) use of inhaled steroids: Secondary | ICD-10-CM | POA: Insufficient documentation

## 2016-01-05 DIAGNOSIS — M542 Cervicalgia: Secondary | ICD-10-CM | POA: Insufficient documentation

## 2016-01-05 MED ORDER — TRAMADOL HCL 50 MG PO TABS: 50.0000 mg | ORAL_TABLET | Freq: Four times a day (QID) | ORAL | Status: DC | PRN

## 2016-01-05 MED ORDER — NAPROXEN 500 MG PO TABS: 500.0000 mg | ORAL_TABLET | Freq: Two times a day (BID) | ORAL | Status: DC

## 2016-01-05 MED ORDER — METHOCARBAMOL 500 MG PO TABS: 500.0000 mg | ORAL_TABLET | Freq: Three times a day (TID) | ORAL | Status: DC | PRN

## 2016-01-05 NOTE — ED Provider Notes (Signed)
CSN: [id number]     Arrival date & time 01/05/16  0747 History   First MD Initiated Contact with Patient 01/05/16 0759     Chief Complaint  Patient presents with  . Arm Pain      HPI  Patient presents for evaluation of left neck back and arm pain third visit in the last 2 weeks for same. States that he originally hurt his back lifting a patient off the floor 2 weeks ago. He works as a Lawyer. His pain along the left lateral neck left posterior shoulder left posterior upper arm. No weakness or tingling or progression of symptoms. He has followed up with states that his insurance card is supposed to be "here anytime". He has orthopedic referral. He has continued symptoms.  Past Medical History  Diagnosis Date  . Seizures (HCC)   . Asthma    History reviewed. No pertinent past surgical history. No family history on file. Social History  Substance Use Topics  . Smoking status: Never Smoker   . Smokeless tobacco: None  . Alcohol Use: No    Review of Systems  Constitutional: Negative for fever, chills, diaphoresis, appetite change and fatigue.  HENT: Negative for mouth sores, sore throat and trouble swallowing.   Eyes: Negative for visual disturbance.  Respiratory: Negative for cough, chest tightness, shortness of breath and wheezing.   Cardiovascular: Negative for chest pain.  Gastrointestinal: Negative for nausea, vomiting, abdominal pain, diarrhea and abdominal distention.  Endocrine: Negative for polydipsia, polyphagia and polyuria.  Genitourinary: Negative for dysuria, frequency and hematuria.  Musculoskeletal: Positive for back pain, arthralgias and neck pain. Negative for gait problem.  Skin: Negative for color change, pallor and rash.  Neurological: Negative for dizziness, syncope, light-headedness and headaches.  Hematological: Does not bruise/bleed easily.  Psychiatric/Behavioral: Negative for behavioral problems and confusion.      Allergies  Review of patient's  allergies indicates no known allergies.  Home Medications   Prior to Admission medications   Medication Sig Start Date End Date Taking? Authorizing Provider  abacavir-dolutegravir-lamiVUDine (TRIUMEQ) 600-50-300 MG tablet Take 1 tablet by mouth daily. 11/15/15  Yes Historical Provider, MD  albuterol (PROAIR HFA) 108 (90 Base) MCG/ACT inhaler Inhale 2 puffs into the lungs every 6 (six) hours as needed for shortness of breath.  10/09/15  Yes Historical Provider, MD  cyclobenzaprine (FLEXERIL) 10 MG tablet Take 1 tablet (10 mg total) by mouth 3 (three) times daily as needed for muscle spasms. 12/23/15  Yes Mercedes Camprubi-Soms, PA-C  fluticasone (FLONASE) 50 MCG/ACT nasal spray Place 1 spray into the nose daily. 10/09/15  Yes Historical Provider, MD  gabapentin (NEURONTIN) 100 MG capsule Take 200 mg by mouth 3 (three) times daily. 07/31/15  Yes Historical Provider, MD  levETIRAcetam (KEPPRA) 500 MG tablet Take 500 mg by mouth 2 (two) times daily. 07/31/15  Yes Historical Provider, MD  nortriptyline (PAMELOR) 50 MG capsule Take 100 mg by mouth at bedtime. 07/31/15  Yes Historical Provider, MD  methocarbamol (ROBAXIN) 500 MG tablet Take 1 tablet (500 mg total) by mouth 3 (three) times daily between meals as needed. 01/05/16   Rolland Porter, MD  naproxen (NAPROSYN) 500 MG tablet Take 1 tablet (500 mg total) by mouth 2 (two) times daily. 01/05/16   Rolland Porter, MD  traMADol (ULTRAM) 50 MG tablet Take 1 tablet (50 mg total) by mouth every 6 (six) hours as needed. 01/05/16   Rolland Porter, MD   BP 129/67 mmHg  Pulse 64  Temp(Src) 98 F (36.7  C) (Oral)  Resp 16  SpO2 100% Physical Exam  Constitutional: He is oriented to person, place, and time. He appears well-developed and well-nourished. No distress.  HENT:  Head: Normocephalic.  Eyes: Conjunctivae are normal. Pupils are equal, round, and reactive to light. No scleral icterus.  Neck: Normal range of motion. Neck supple. No thyromegaly present.  Cardiovascular:  Normal rate and regular rhythm.  Exam reveals no gallop and no friction rub.   No murmur heard. Pulmonary/Chest: Effort normal and breath sounds normal. No respiratory distress. He has no wheezes. He has no rales.  Abdominal: Soft. Bowel sounds are normal. He exhibits no distension. There is no tenderness. There is no rebound.  Musculoskeletal: Normal range of motion.       Back:  Tenderness reproducible along the paraspinal musculature. Cervicitis fossa. Medial scapula in the rhomboids along the course of the trapezius.  No radicular pattern or neurological symptoms. No demonstrable weakness or decreased sensation. Normal neurovascular exam to left upper cavity without edema. Strong pulses.  Neurological: He is alert and oriented to person, place, and time.  Skin: Skin is warm and dry. No rash noted.  Psychiatric: He has a normal mood and affect. His behavior is normal.    ED Course  Procedures (including critical care time) Labs Review Labs Reviewed - No data to display  Imaging Review No results found. I have personally reviewed and evaluated these images and lab results as part of my medical decision-making.   EKG Interpretation None      MDM   Final diagnoses:  Muscular pain    Given physical therapy referral. Also orthopedic referral. Think his pain is muscular likely simply needs some home exercise program. Perhaps some local care from physical therapy as well. Plan Ultram, Robaxin, naproxen.    Rolland Porter, MD 01/05/16 [phone]

## 2016-01-05 NOTE — ED Notes (Signed)
Pt complaint of left neck, back, and arm pain worsening last night post assisting lifting; pt reports evaluated for same 3 weeks ago.

## 2016-05-14 ENCOUNTER — Emergency Department (HOSPITAL_COMMUNITY): Payer: Self-pay

## 2016-05-14 ENCOUNTER — Encounter (HOSPITAL_COMMUNITY): Payer: Self-pay | Admitting: Nurse Practitioner

## 2016-05-14 ENCOUNTER — Emergency Department (HOSPITAL_COMMUNITY)
Admission: EM | Admit: 2016-05-14 | Discharge: 2016-05-15 | Disposition: A | Discharge status: Home / Self Care | Payer: Self-pay | Attending: Emergency Medicine | Admitting: Emergency Medicine

## 2016-05-14 DIAGNOSIS — J9801 Acute bronchospasm: Secondary | ICD-10-CM | POA: Insufficient documentation

## 2016-05-14 DIAGNOSIS — J302 Other seasonal allergic rhinitis: Secondary | ICD-10-CM | POA: Insufficient documentation

## 2016-05-14 DIAGNOSIS — Z21 Asymptomatic human immunodeficiency virus [HIV] infection status: Secondary | ICD-10-CM | POA: Insufficient documentation

## 2016-05-14 DIAGNOSIS — R0789 Other chest pain: Secondary | ICD-10-CM | POA: Insufficient documentation

## 2016-05-14 DIAGNOSIS — F17210 Nicotine dependence, cigarettes, uncomplicated: Secondary | ICD-10-CM | POA: Insufficient documentation

## 2016-05-14 HISTORY — DX: Human immunodeficiency virus (HIV) disease: B20

## 2016-05-14 HISTORY — DX: Asymptomatic human immunodeficiency virus (hiv) infection status: Z21

## 2016-05-14 LAB — BASIC METABOLIC PANEL
Anion gap: 6 (ref 5–15)
BUN: 12 mg/dL (ref 6–20)
CALCIUM: 9.8 mg/dL (ref 8.9–10.3)
CHLORIDE: 106 mmol/L (ref 101–111)
CO2: 28 mmol/L (ref 22–32)
CREATININE: 1.15 mg/dL (ref 0.61–1.24)
Glucose, Bld: 83 mg/dL (ref 65–99)
Potassium: 3.5 mmol/L (ref 3.5–5.1)
SODIUM: 140 mmol/L (ref 135–145)

## 2016-05-14 LAB — CBC
HCT: 40.1 % (ref 39.0–52.0)
Hemoglobin: 13.1 g/dL (ref 13.0–17.0)
MCH: 30.5 pg (ref 26.0–34.0)
MCHC: 32.7 g/dL (ref 30.0–36.0)
MCV: 93.5 fL (ref 78.0–100.0)
PLATELETS: 183 10*3/uL (ref 150–400)
RBC: 4.29 MIL/uL (ref 4.22–5.81)
RDW: 12.2 % (ref 11.5–15.5)
WBC: 5.6 10*3/uL (ref 4.0–10.5)

## 2016-05-14 LAB — I-STAT TROPONIN, ED: TROPONIN I, POC: 0.04 ng/mL (ref 0.00–0.08)

## 2016-05-14 MED ORDER — PREDNISONE 20 MG PO TABS: 60.0000 mg | ORAL_TABLET | Freq: Every day | ORAL | Status: DC

## 2016-05-14 MED ORDER — PREDNISONE 20 MG PO TABS
60.0000 mg | ORAL_TABLET | Freq: Once | ORAL | Status: AC
  Administered 2016-05-14: 60 mg via ORAL
  Filled 2016-05-14: qty 3

## 2016-05-14 MED ORDER — ALBUTEROL SULFATE HFA 108 (90 BASE) MCG/ACT IN AERS
2.0000 | INHALATION_SPRAY | RESPIRATORY_TRACT | Status: DC | PRN
  Administered 2016-05-14: 2 via RESPIRATORY_TRACT
  Filled 2016-05-14: qty 6.7

## 2016-05-14 MED ORDER — LORATADINE 10 MG PO TABS
10.0000 mg | ORAL_TABLET | Freq: Once | ORAL | Status: AC
  Administered 2016-05-14: 10 mg via ORAL
  Filled 2016-05-14: qty 1

## 2016-05-14 MED ORDER — LORATADINE 10 MG PO TABS: 10.0000 mg | ORAL_TABLET | Freq: Every day | ORAL | Status: DC

## 2016-05-14 MED ORDER — IBUPROFEN 600 MG PO TABS: 600.0000 mg | ORAL_TABLET | Freq: Four times a day (QID) | ORAL | Status: DC | PRN

## 2016-05-14 MED ORDER — FLUTICASONE PROPIONATE 50 MCG/ACT NA SUSP: 1.0000 | Freq: Every day | NASAL | Status: AC

## 2016-05-14 MED ORDER — IBUPROFEN 400 MG PO TABS
600.0000 mg | ORAL_TABLET | Freq: Once | ORAL | Status: AC
  Administered 2016-05-14: 600 mg via ORAL
  Filled 2016-05-14: qty 1

## 2016-05-14 NOTE — ED Notes (Signed)
Pt c/o 1 day history of CP,sob, cough, sinus congestion, dizziness, fatigue, headaches, loss of appetite, nausea, loose stools. Denies fevers, vomiting, urinary changes. He tried pepto bismol with some relief. He is alert and breathing easily

## 2016-05-14 NOTE — Discharge Instructions (Signed)
Allergic Rhinitis Allergic rhinitis is when the mucous membranes in the nose respond to allergens. Allergens are particles in the air that cause your body to have an allergic reaction. This causes you to release allergic antibodies. Through a chain of events, these eventually cause you to release histamine into the blood stream. Although meant to protect the body, it is this release of histamine that causes your discomfort, such as frequent sneezing, congestion, and an itchy, runny nose.  CAUSES Seasonal allergic rhinitis (hay fever) is caused by pollen allergens that may come from grasses, trees, and weeds. Year-round allergic rhinitis (perennial allergic rhinitis) is caused by allergens such as house dust mites, pet dander, and mold spores. SYMPTOMS  Nasal stuffiness (congestion).  Itchy, runny nose with sneezing and tearing of the eyes. DIAGNOSIS Your health care provider can help you determine the allergen or allergens that trigger your symptoms. If you and your health care provider are unable to determine the allergen, skin or blood testing may be used. Your health care provider will diagnose your condition after taking your health history and performing a physical exam. Your health care provider may assess you for other related conditions, such as asthma, pink eye, or an ear infection. TREATMENT Allergic rhinitis does not have a cure, but it can be controlled by:  Medicines that block allergy symptoms. These may include allergy shots, nasal sprays, and oral antihistamines.  Avoiding the allergen. Hay fever may often be treated with antihistamines in pill or nasal spray forms. Antihistamines block the effects of histamine. There are over-the-counter medicines that may help with nasal congestion and swelling around the eyes. Check with your health care provider before taking or giving this medicine. If avoiding the allergen or the medicine prescribed do not work, there are many new medicines  your health care provider can prescribe. Stronger medicine may be used if initial measures are ineffective. Desensitizing injections can be used if medicine and avoidance does not work. Desensitization is when a patient is given ongoing shots until the body becomes less sensitive to the allergen. Make sure you follow up with your health care provider if problems continue. HOME CARE INSTRUCTIONS It is not possible to completely avoid allergens, but you can reduce your symptoms by taking steps to limit your exposure to them. It helps to know exactly what you are allergic to so that you can avoid your specific triggers. SEEK MEDICAL CARE IF:  You have a fever.  You develop a cough that does not stop easily (persistent).  You have shortness of breath.  You start wheezing.  Symptoms interfere with normal daily activities.   This information is not intended to replace advice given to you by your health care provider. Make sure you discuss any questions you have with your health care provider.   Document Released: 08/12/2001 Document Revised: 12/08/2014 Document Reviewed: 07/25/2013 Elsevier Interactive Patient Education 2016 Elsevier Inc.  Chest Wall Pain Chest wall pain is pain in or around the bones and muscles of your chest. Sometimes, an injury causes this pain. Sometimes, the cause may not be known. This pain may take several weeks or longer to get better. HOME CARE INSTRUCTIONS  Pay attention to any changes in your symptoms. Take these actions to help with your pain:   Rest as told by your health care provider.   Avoid activities that cause pain. These include any activities that use your chest muscles or your abdominal and side muscles to lift heavy items.   If directed,  apply ice to the painful area:  Put ice in a plastic bag.  Place a towel between your skin and the bag.  Leave the ice on for 20 minutes, 2-3 times per day.  Take over-the-counter and prescription medicines  only as told by your health care provider.  Do not use tobacco products, including cigarettes, chewing tobacco, and e-cigarettes. If you need help quitting, ask your health care provider.  Keep all follow-up visits as told by your health care provider. This is important. SEEK MEDICAL CARE IF:  You have a fever.  Your chest pain becomes worse.  You have new symptoms. SEEK IMMEDIATE MEDICAL CARE IF:  You have nausea or vomiting.  You feel sweaty or light-headed.  You have a cough with phlegm (sputum) or you cough up blood.  You develop shortness of breath.   This information is not intended to replace advice given to you by your health care provider. Make sure you discuss any questions you have with your health care provider.   Document Released: 11/17/2005 Document Revised: 08/08/2015 Document Reviewed: 02/12/2015 Elsevier Interactive Patient Education 2016 Elsevier Inc.   Bronchospasm, Adult A bronchospasm is a spasm or tightening of the airways going into the lungs. During a bronchospasm breathing becomes more difficult because the airways get smaller. When this happens there can be coughing, a whistling sound when breathing (wheezing), and difficulty breathing. Bronchospasm is often associated with asthma, but not all patients who experience a bronchospasm have asthma. CAUSES  A bronchospasm is caused by inflammation or irritation of the airways. The inflammation or irritation may be triggered by:   Allergies (such as to animals, pollen, food, or mold). Allergens that cause bronchospasm may cause wheezing immediately after exposure or many hours later.   Infection. Viral infections are believed to be the most common cause of bronchospasm.   Exercise.   Irritants (such as pollution, cigarette smoke, strong odors, aerosol sprays, and paint fumes).   Weather changes. Winds increase molds and pollens in the air. Rain refreshes the air by washing irritants out. Cold air may  cause inflammation.   Stress and emotional upset.  SIGNS AND SYMPTOMS   Wheezing.   Excessive nighttime coughing.   Frequent or severe coughing with a simple cold.   Chest tightness.   Shortness of breath.  DIAGNOSIS  Bronchospasm is usually diagnosed through a history and physical exam. Tests, such as chest X-rays, are sometimes done to look for other conditions. TREATMENT   Inhaled medicines can be given to open up your airways and help you breathe. The medicines can be given using either an inhaler or a nebulizer machine.  Corticosteroid medicines may be given for severe bronchospasm, usually when it is associated with asthma. HOME CARE INSTRUCTIONS   Always have a plan prepared for seeking medical care. Know when to call your health care provider and local emergency services (911 in the [place] Know where you can access local emergency care.  Only take medicines as directed by your health care provider.  If you were prescribed an inhaler or nebulizer machine, ask your health care provider to explain how to use it correctly. Always use a spacer with your inhaler if you were given one.  It is necessary to remain calm during an attack. Try to relax and breathe more slowly.  Control your home environment in the following ways:   Change your heating and air conditioning filter at least once a month.   Limit your use of fireplaces and wood  stoves.  Do not smoke and do not allow smoking in your home.   Avoid exposure to perfumes and fragrances.   Get rid of pests (such as roaches and mice) and their droppings.   Throw away plants if you see mold on them.   Keep your house clean and dust free.   Replace carpet with wood, tile, or vinyl flooring. Carpet can trap dander and dust.   Use allergy-proof pillows, mattress covers, and box spring covers.   Wash bed sheets and blankets every week in hot water and dry them in a dryer.   Use blankets that are  made of polyester or cotton.   Wash hands frequently. SEEK MEDICAL CARE IF:   You have muscle aches.   You have chest pain.   The sputum changes from clear or white to yellow, green, gray, or bloody.   The sputum you cough up gets thicker.   There are problems that may be related to the medicine you are given, such as a rash, itching, swelling, or trouble breathing.  SEEK IMMEDIATE MEDICAL CARE IF:   You have worsening wheezing and coughing even after taking your prescribed medicines.   You have increased difficulty breathing.   You develop severe chest pain. MAKE SURE YOU:   Understand these instructions.  Will watch your condition.  Will get help right away if you are not doing well or get worse.   This information is not intended to replace advice given to you by your health care provider. Make sure you discuss any questions you have with your health care provider.   Document Released: 11/20/2003 Document Revised: 12/08/2014 Document Reviewed: 05/09/2013 Elsevier Interactive Patient Education [employer] Elsevier Inc.

## 2016-05-14 NOTE — ED Provider Notes (Signed)
CSN: [id number]     Arrival date & time 05/14/16  1838 History   First MD Initiated Contact with Patient 05/14/16 2300     Chief Complaint  Patient presents with  . Chest Pain     (Consider location/radiation/quality/duration/timing/severity/associated sxs/prior Treatment) HPI Patient with history of seasonal allergies and asthma presents with nasal congestion, sinus congestion, sore throat, chest tightness and shortness of breath. He's had minimal nonproductive cough. States he ran out of his days. No fever or chills. No lower extremity swelling or pain. Past Medical History  Diagnosis Date  . Seizures (HCC)   . Asthma   . HIV (human immunodeficiency virus infection) (HCC)    History reviewed. No pertinent past surgical history. History reviewed. No pertinent family history. Social History  Substance Use Topics  . Smoking status: Current Every Day Smoker    Types: Cigarettes  . Smokeless tobacco: None  . Alcohol Use: Yes    Review of Systems  Constitutional: Negative for fever, chills and fatigue.  HENT: Positive for congestion, sinus pressure and sore throat.   Respiratory: Positive for cough, chest tightness and shortness of breath.   Cardiovascular: Positive for chest pain. Negative for palpitations and leg swelling.  Gastrointestinal: Negative for nausea, vomiting, abdominal pain, diarrhea and constipation.  Musculoskeletal: Negative for myalgias, back pain, neck pain and neck stiffness.  Skin: Negative for rash and wound.  Neurological: Positive for headaches. Negative for dizziness, syncope, weakness, light-headedness and numbness.  All other systems reviewed and are negative.     Allergies  Bee venom  Home Medications   Prior to Admission medications   Medication Sig Start Date End Date Taking? Authorizing Provider  abacavir-dolutegravir-lamiVUDine (TRIUMEQ) 600-50-300 MG tablet Take 1 tablet by mouth daily. 11/15/15   Historical Provider, MD  albuterol  (PROAIR HFA) 108 (90 Base) MCG/ACT inhaler Inhale 2 puffs into the lungs every 6 (six) hours as needed for shortness of breath.  10/09/15   Historical Provider, MD  cyclobenzaprine (FLEXERIL) 10 MG tablet Take 1 tablet (10 mg total) by mouth 3 (three) times daily as needed for muscle spasms. 12/23/15   Mercedes Camprubi-Soms, PA-C  fluticasone (FLONASE) 50 MCG/ACT nasal spray Place 1 spray into both nostrils daily. 05/14/16   Loren Racer, MD  gabapentin (NEURONTIN) 100 MG capsule Take 200 mg by mouth 3 (three) times daily. 07/31/15   Historical Provider, MD  ibuprofen (ADVIL,MOTRIN) 600 MG tablet Take 1 tablet (600 mg total) by mouth every 6 (six) hours as needed for headache or moderate pain. 05/14/16   Loren Racer, MD  levETIRAcetam (KEPPRA) 500 MG tablet Take 500 mg by mouth 2 (two) times daily. 07/31/15   Historical Provider, MD  loratadine (CLARITIN) 10 MG tablet Take 1 tablet (10 mg total) by mouth daily. 05/14/16   Loren Racer, MD  methocarbamol (ROBAXIN) 500 MG tablet Take 1 tablet (500 mg total) by mouth 3 (three) times daily between meals as needed. 01/05/16   Rolland Porter, MD  naproxen (NAPROSYN) 500 MG tablet Take 1 tablet (500 mg total) by mouth 2 (two) times daily. 01/05/16   Rolland Porter, MD  nortriptyline (PAMELOR) 50 MG capsule Take 100 mg by mouth at bedtime. 07/31/15   Historical Provider, MD  predniSONE (DELTASONE) 20 MG tablet Take 3 tablets (60 mg total) by mouth daily. 05/14/16   Loren Racer, MD  traMADol (ULTRAM) 50 MG tablet Take 1 tablet (50 mg total) by mouth every 6 (six) hours as needed. 01/05/16   Rolland Porter, MD  BP 112/66 mmHg  Pulse 64  Temp(Src) 98.4 F (36.9 C) (Oral)  Resp 15  SpO2 100% Physical Exam  Constitutional: He is oriented to person, place, and time. He appears well-developed and well-nourished. No distress.  Patient is very well-appearing. He is in no distress whatsoever.  HENT:  Head: Normocephalic and atraumatic.  Mouth/Throat: Oropharynx is clear  and moist.  Bilateral nasal mucosal edema. Mild maxillary sinus tenderness to percussion bilaterally. Oropharynx is erythematous without tonsillar exudates. Uvula is midline.  Eyes: EOM are normal. Pupils are equal, round, and reactive to light.  Neck: Normal range of motion. Neck supple.  No meningismus  Cardiovascular: Normal rate and regular rhythm.  Exam reveals no gallop and no friction rub.   No murmur heard. Pulmonary/Chest: Effort normal. No respiratory distress. He has no wheezes. He has no rales. He exhibits tenderness.  Prolonged expiratory phase but no definite wheezing appreciated. Patient chest tenderness is completely reproduced with palpation over the anterior left chest and left sternal region. There is no crepitance or deformity.  Abdominal: Soft. Bowel sounds are normal. He exhibits no distension and no mass. There is no tenderness. There is no rebound and no guarding.  Musculoskeletal: Normal range of motion. He exhibits no edema or tenderness.  No lower extremity swelling or asymmetry.  Neurological: He is alert and oriented to person, place, and time.  Moves all extremities without deficit. Sensation is fully intact.  Skin: Skin is warm and dry. No rash noted. No erythema.  Psychiatric: He has a normal mood and affect. His behavior is normal.  Nursing note and vitals reviewed.   ED Course  Procedures (including critical care time) Labs Review Labs Reviewed  BASIC METABOLIC PANEL  CBC  I-STAT TROPOININ, ED    Imaging Review Dg Chest 2 View  05/14/2016  CLINICAL DATA:  Acute onset of cough, shortness of breath and generalized chest pain. Initial encounter. EXAM: CHEST  2 VIEW COMPARISON:  None. FINDINGS: The lungs are well-aerated and clear. There is no evidence of focal opacification, pleural effusion or pneumothorax. The heart is normal in size; the mediastinal contour is within normal limits. No acute osseous abnormalities are seen. IMPRESSION: No acute  cardiopulmonary process seen. Electronically Signed   By: Roanna RaiderJeffery  Chang M.D.   On: 05/14/2016 23:45   I have personally reviewed and evaluated these images and lab results as part of my medical decision-making.   EKG Interpretation   Date/Time:  Wednesday May 14 2016 18:48:07 EDT Ventricular Rate:  61 PR Interval:  174 QRS Duration: 96 QT Interval:  412 QTC Calculation: 414 R Axis:   90 Text Interpretation:  Normal sinus rhythm Rightward axis ST elevation,  consider early repolarization, pericarditis, or injury Abnormal ECG  Confirmed by Ranae PalmsYELVERTON  MD, Ryzen Deady ([id number]) on 05/14/2016 6:55:57 PM      MDM   Final diagnoses:  Seasonal allergies  Chest wall pain  Bronchospasm     Patient with history of allergies and asthma presents with allergic an asthmatic symptoms. Chest x-ray without acute findings. Patient's labs are normal including white blood cell count. Patient has normal vital signs satting 100% on room air. Patient has evidence of early re-pole on his EKG. I have very low suspicion for coronary artery disease or for pericarditis. Given albuterol inhaler and steroid in the emergency department. Also will start on antihistamine. Patient is advised to establish care with a primary physician. He's been given return precautions and is voiced understanding.   Loren Raceravid Bernarr Longsworth, MD  05/15/16 0004 

## 2016-07-28 ENCOUNTER — Emergency Department (HOSPITAL_COMMUNITY)
Admission: EM | Admit: 2016-07-28 | Discharge: 2016-07-28 | Disposition: A | Discharge status: Home / Self Care | Payer: Self-pay | Attending: Emergency Medicine | Admitting: Emergency Medicine

## 2016-07-28 ENCOUNTER — Encounter (HOSPITAL_COMMUNITY): Payer: Self-pay | Admitting: Emergency Medicine

## 2016-07-28 DIAGNOSIS — G40909 Epilepsy, unspecified, not intractable, without status epilepticus: Secondary | ICD-10-CM | POA: Insufficient documentation

## 2016-07-28 DIAGNOSIS — F1290 Cannabis use, unspecified, uncomplicated: Secondary | ICD-10-CM | POA: Insufficient documentation

## 2016-07-28 DIAGNOSIS — Z79899 Other long term (current) drug therapy: Secondary | ICD-10-CM | POA: Insufficient documentation

## 2016-07-28 DIAGNOSIS — J45909 Unspecified asthma, uncomplicated: Secondary | ICD-10-CM | POA: Insufficient documentation

## 2016-07-28 DIAGNOSIS — Z21 Asymptomatic human immunodeficiency virus [HIV] infection status: Secondary | ICD-10-CM | POA: Insufficient documentation

## 2016-07-28 DIAGNOSIS — F17210 Nicotine dependence, cigarettes, uncomplicated: Secondary | ICD-10-CM | POA: Insufficient documentation

## 2016-07-28 DIAGNOSIS — Z7951 Long term (current) use of inhaled steroids: Secondary | ICD-10-CM | POA: Insufficient documentation

## 2016-07-28 DIAGNOSIS — Z791 Long term (current) use of non-steroidal anti-inflammatories (NSAID): Secondary | ICD-10-CM | POA: Insufficient documentation

## 2016-07-28 DIAGNOSIS — R569 Unspecified convulsions: Secondary | ICD-10-CM

## 2016-07-28 LAB — CBC WITH DIFFERENTIAL/PLATELET
Basophils Absolute: 0 10*3/uL (ref 0.0–0.1)
Basophils Relative: 0 %
Eosinophils Absolute: 0.2 10*3/uL (ref 0.0–0.7)
Eosinophils Relative: 4 %
HCT: 37.2 % — ABNORMAL LOW (ref 39.0–52.0)
Hemoglobin: 12.9 g/dL — ABNORMAL LOW (ref 13.0–17.0)
Lymphocytes Relative: 51 %
Lymphs Abs: 2.1 10*3/uL (ref 0.7–4.0)
MCH: 32.6 pg (ref 26.0–34.0)
MCHC: 34.7 g/dL (ref 30.0–36.0)
MCV: 93.9 fL (ref 78.0–100.0)
Monocytes Absolute: 0.4 10*3/uL (ref 0.1–1.0)
Monocytes Relative: 10 %
Neutro Abs: 1.5 10*3/uL — ABNORMAL LOW (ref 1.7–7.7)
Neutrophils Relative %: 35 %
Platelets: 181 10*3/uL (ref 150–400)
RBC: 3.96 MIL/uL — ABNORMAL LOW (ref 4.22–5.81)
RDW: 12.1 % (ref 11.5–15.5)
WBC: 4.2 10*3/uL (ref 4.0–10.5)

## 2016-07-28 LAB — BASIC METABOLIC PANEL
Anion gap: 6 (ref 5–15)
BUN: 16 mg/dL (ref 6–20)
CO2: 27 mmol/L (ref 22–32)
Calcium: 9.3 mg/dL (ref 8.9–10.3)
Chloride: 107 mmol/L (ref 101–111)
Creatinine, Ser: 1.24 mg/dL (ref 0.61–1.24)
GFR calc Af Amer: >60 mL/min (ref >60)
GFR calc non Af Amer: >60 mL/min (ref >60)
Glucose, Bld: 81 mg/dL (ref 65–99)
Potassium: 3.5 mmol/L (ref 3.5–5.1)
Sodium: 140 mmol/L (ref 135–145)

## 2016-07-28 MED ORDER — KETOROLAC TROMETHAMINE 15 MG/ML IJ SOLN
15.0000 mg | Freq: Once | INTRAMUSCULAR | Status: AC
  Administered 2016-07-28: 15 mg via INTRAVENOUS
  Filled 2016-07-28: qty 1

## 2016-07-28 MED ORDER — DIPHENHYDRAMINE HCL 50 MG/ML IJ SOLN
12.5000 mg | Freq: Once | INTRAMUSCULAR | Status: AC
  Administered 2016-07-28: 12.5 mg via INTRAVENOUS
  Filled 2016-07-28: qty 1

## 2016-07-28 MED ORDER — PROCHLORPERAZINE EDISYLATE 5 MG/ML IJ SOLN
10.0000 mg | Freq: Once | INTRAMUSCULAR | Status: AC
  Administered 2016-07-28: 10 mg via INTRAVENOUS
  Filled 2016-07-28: qty 2

## 2016-07-28 MED ORDER — SODIUM CHLORIDE 0.9 % IV BOLUS (SEPSIS)
1000.0000 mL | Freq: Once | INTRAVENOUS | Status: AC
  Administered 2016-07-28: 1000 mL via INTRAVENOUS

## 2016-07-28 NOTE — ED Triage Notes (Signed)
Patient presents from work via ems for seizure. Migraine before seizure. No incontinence, no oral trauma.   Last VS: 114/69, 100% 2L [place], 59hr, [id number]  18g L AC

## 2016-07-28 NOTE — ED Notes (Signed)
Pt would like labs pulled from IV - RN aware and will notify us if we need to stick him.

## 2016-07-28 NOTE — Progress Notes (Signed)
EDCM spoke to patient at bedside. Patient confirms he [place] a [place]cp [place]  [facility], informed patient of services there and walk in times.  EDCM also provided patient with list of pcps who accept self pay patients, list of discount pharmacies and websites needymeds.org and GoodRX.com for medication assistance, phone number to inquire about the orange card, phone number to inquire about Mediciad, phone number to inquire about the Affordable Care Act, financial resources in the community such as local churches, [employer], urban ministries, and dental assistance for uninsured patients.  Patient thankful for resources.  No further EDCM needs at this time.  Patient reports he has Keppra at home.

## 2016-07-28 NOTE — Discharge Instructions (Signed)
Follow up with your neurologist.

## 2016-07-28 NOTE — ED Provider Notes (Signed)
[facility] Provider Note   CSN: [id number] Arrival date & time: 07/28/16  1721  By signing my name below, I, Placido Sou, attest that this documentation has been prepared under the direction and in the presence of Raeford Razor, MD. Electronically Signed: Placido Sou, ED Scribe. 07/28/16. 6:01 PM.   History   Chief Complaint No chief complaint on file.   HPI HPI Comments: Lee Austin is a 30 y.o. male with a history of seizures and HIV who presents to the Emergency Department complaining of a seizure which occurred PTA. Pt states that he was resting between two different jobs and felt general malaise. He states he then went to his second job and experienced a seizure. Pt states his symptoms prior to his seizure were consistent with prior seizures and reports a FMHx of seizures. Pt reports an associated HA and fatigue. His last seizure was 2 years ago and he takes 500 mg keppra 2x per day. He confirms his h/o HIV and states he is compliant with his medications and his viral load was undetectable during his most recent evaluation.  Pt has a h/o smoking and also intermittently smokes marijuana which she states is for managing his decreased appetite. Pt denies incontinence of his bowels or bladder, fever, chills, oral trauma or other associated symptoms at this time.   Infectious Disease: Dr. Freida Busman Neurologist: Dr. Hyacinth Meeker at [facility]   The history is provided by the patient and medical records. No language interpreter was used.    Past Medical History:  Diagnosis Date  . Asthma   . HIV (human immunodeficiency virus infection) (HCC)   . Seizures (HCC)     There are no active problems to display for this patient.   No past surgical history on file.     Home Medications    Prior to Admission medications   Medication Sig Start Date End Date Taking? Authorizing Provider  abacavir-dolutegravir-lamiVUDine (TRIUMEQ) 600-50-300 MG tablet Take 1 tablet by  mouth daily. 11/15/15   Historical Provider, MD  albuterol (PROAIR HFA) 108 (90 Base) MCG/ACT inhaler Inhale 2 puffs into the lungs every 6 (six) hours as needed for shortness of breath.  10/09/15   Historical Provider, MD  cyclobenzaprine (FLEXERIL) 10 MG tablet Take 1 tablet (10 mg total) by mouth 3 (three) times daily as needed for muscle spasms. 12/23/15   Mercedes Camprubi-Soms, PA-C  fluticasone (FLONASE) 50 MCG/ACT nasal spray Place 1 spray into both nostrils daily. 05/14/16   Loren Racer, MD  gabapentin (NEURONTIN) 100 MG capsule Take 200 mg by mouth 3 (three) times daily. 07/31/15   Historical Provider, MD  ibuprofen (ADVIL,MOTRIN) 600 MG tablet Take 1 tablet (600 mg total) by mouth every 6 (six) hours as needed for headache or moderate pain. 05/14/16   Loren Racer, MD  levETIRAcetam (KEPPRA) 500 MG tablet Take 500 mg by mouth 2 (two) times daily. 07/31/15   Historical Provider, MD  loratadine (CLARITIN) 10 MG tablet Take 1 tablet (10 mg total) by mouth daily. 05/14/16   Loren Racer, MD  methocarbamol (ROBAXIN) 500 MG tablet Take 1 tablet (500 mg total) by mouth 3 (three) times daily between meals as needed. 01/05/16   Rolland Porter, MD  naproxen (NAPROSYN) 500 MG tablet Take 1 tablet (500 mg total) by mouth 2 (two) times daily. 01/05/16   Rolland Porter, MD  nortriptyline (PAMELOR) 50 MG capsule Take 100 mg by mouth at bedtime. 07/31/15   Historical Provider, MD  predniSONE (DELTASONE) 20 MG tablet  Take 3 tablets (60 mg total) by mouth daily. 05/14/16   Loren Racer, MD  traMADol (ULTRAM) 50 MG tablet Take 1 tablet (50 mg total) by mouth every 6 (six) hours as needed. 01/05/16   Rolland Porter, MD    Family History No family history on file.  Social History Social History  Substance Use Topics  . Smoking status: Current Every Day Smoker    Types: Cigarettes  . Smokeless tobacco: Not on file  . Alcohol use Yes     Allergies   Bee venom   Review of Systems Review of Systems    Constitutional: Positive for fatigue. Negative for chills and fever.  Skin: Negative for wound.  Neurological: Positive for seizures, syncope and headaches.  All other systems reviewed and are negative.  Physical Exam Updated Vital Signs BP 104/88 (BP Location: Right Arm)   Pulse (!) 58   Temp 98.2 F (36.8 C) (Oral)   Resp 12   SpO2 97%   Physical Exam  Constitutional: He is oriented to person, place, and time. He appears well-developed and well-nourished.  Pt appears drowsy but answers questions normally  HENT:  Head: Normocephalic and atraumatic.  Eyes: EOM are normal. Pupils are equal, round, and reactive to light.  Neck: Normal range of motion.  Cardiovascular: Regular rhythm.   Pulmonary/Chest: Effort normal.  Abdominal: Soft. He exhibits no distension.  Musculoskeletal: Normal range of motion.  Neurological: He is alert and oriented to person, place, and time. He has normal strength. No cranial nerve deficit or sensory deficit. Coordination normal.  5/5 strength in major muscle groups of  bilateral upper and lower extremities. Speech normal. No facial asymetry.   Psychiatric: He has a normal mood and affect.  Nursing note and vitals reviewed.  ED Treatments / Results  Labs (all labs ordered are listed, but only abnormal results are displayed) Labs Reviewed  CBC WITH DIFFERENTIAL/PLATELET - Abnormal; Notable for the following:       Result Value   RBC 3.96 (*)    Hemoglobin 12.9 (*)    HCT 37.2 (*)    Neutro Abs 1.5 (*)    All other components within normal limits  BASIC METABOLIC PANEL    EKG  EKG Interpretation  Date/Time:  Monday July 28 2016 17:37:14 EDT Ventricular Rate:  60 PR Interval:    QRS Duration: 97 QT Interval:  404 QTC Calculation: 404 R Axis:   91 Text Interpretation:  Sinus rhythm Consider left atrial enlargement Borderline right axis deviation Early repolarization Confirmed by Juleen China  MD, Tracer Gutridge ([id number]) on 07/28/2016 6:11:17 PM       Radiology No results found.  Procedures Procedures  DIAGNOSTIC STUDIES: Oxygen Saturation is 97% on RA, normal by my interpretation.    COORDINATION OF CARE: 5:44 PM Discussed next steps with pt. Pt verbalized understanding and is agreeable with the plan.    Medications Ordered in ED Medications - No data to display  Initial Impression / Assessment and Plan / ED Course  I have reviewed the triage vital signs and the nursing notes.  Pertinent labs & imaging results that were available during my care of the patient were reviewed by me and considered in my medical decision making (see chart for details).  Clinical Course   I personally preformed the services scribed in my presence. The recorded information has been reviewed is accurate. Raeford Razor, MD.  Final Clinical Impressions(s) / ED Diagnoses   Final diagnoses:  Seizure [facility])    New  Prescriptions New Prescriptions   No medications on file     Raeford RazorStephen Nathanel Tallman, MD 08/10/16 1353

## 2016-09-16 ENCOUNTER — Ambulatory Visit (HOSPITAL_COMMUNITY)
Admission: EM | Admit: 2016-09-16 | Discharge: 2016-09-16 | Disposition: A | Discharge status: Home / Self Care | Payer: Self-pay | Attending: Family Medicine | Admitting: Family Medicine

## 2016-09-16 ENCOUNTER — Encounter (HOSPITAL_COMMUNITY): Payer: Self-pay | Admitting: Emergency Medicine

## 2016-09-16 DIAGNOSIS — F17210 Nicotine dependence, cigarettes, uncomplicated: Secondary | ICD-10-CM | POA: Insufficient documentation

## 2016-09-16 DIAGNOSIS — J45909 Unspecified asthma, uncomplicated: Secondary | ICD-10-CM | POA: Insufficient documentation

## 2016-09-16 DIAGNOSIS — G40909 Epilepsy, unspecified, not intractable, without status epilepticus: Secondary | ICD-10-CM | POA: Insufficient documentation

## 2016-09-16 DIAGNOSIS — B20 Human immunodeficiency virus [HIV] disease: Secondary | ICD-10-CM | POA: Insufficient documentation

## 2016-09-16 DIAGNOSIS — R42 Dizziness and giddiness: Secondary | ICD-10-CM | POA: Insufficient documentation

## 2016-09-16 DIAGNOSIS — M791 Myalgia: Secondary | ICD-10-CM | POA: Insufficient documentation

## 2016-09-16 DIAGNOSIS — R197 Diarrhea, unspecified: Secondary | ICD-10-CM | POA: Insufficient documentation

## 2016-09-16 DIAGNOSIS — R5383 Other fatigue: Secondary | ICD-10-CM | POA: Insufficient documentation

## 2016-09-16 DIAGNOSIS — Z79899 Other long term (current) drug therapy: Secondary | ICD-10-CM | POA: Insufficient documentation

## 2016-09-16 LAB — CBC WITH DIFFERENTIAL/PLATELET
Basophils Absolute: 0 10*3/uL (ref 0.0–0.1)
Basophils Relative: 0 %
Eosinophils Absolute: 0.2 10*3/uL (ref 0.0–0.7)
Eosinophils Relative: 3 %
HCT: 41.3 % (ref 39.0–52.0)
Hemoglobin: 13.8 g/dL (ref 13.0–17.0)
Lymphocytes Relative: 28 %
Lymphs Abs: 1.9 10*3/uL (ref 0.7–4.0)
MCH: 31.8 pg (ref 26.0–34.0)
MCHC: 33.4 g/dL (ref 30.0–36.0)
MCV: 95.2 fL (ref 78.0–100.0)
Monocytes Absolute: 0.4 10*3/uL (ref 0.1–1.0)
Monocytes Relative: 6 %
Neutro Abs: 4.5 10*3/uL (ref 1.7–7.7)
Neutrophils Relative %: 63 %
Platelets: 200 10*3/uL (ref 150–400)
RBC: 4.34 MIL/uL (ref 4.22–5.81)
RDW: 12.4 % (ref 11.5–15.5)
WBC: 7 10*3/uL (ref 4.0–10.5)

## 2016-09-16 LAB — POCT URINALYSIS DIP (DEVICE)
Bilirubin Urine: NEGATIVE
GLUCOSE, UA: NEGATIVE mg/dL
Hgb urine dipstick: NEGATIVE
Ketones, ur: NEGATIVE mg/dL
LEUKOCYTES UA: NEGATIVE
NITRITE: NEGATIVE
Protein, ur: NEGATIVE mg/dL
Specific Gravity, Urine: 1.02 (ref 1.005–1.030)
UROBILINOGEN UA: 0.2 mg/dL (ref 0.0–1.0)
pH: 6 (ref 5.0–8.0)

## 2016-09-16 NOTE — Discharge Instructions (Signed)
This appears to be a viral infection.  Tylenol and ibuprofen should ease your symptoms.  You need to follow up with your doctor if these symptoms continue.

## 2016-09-16 NOTE — ED Triage Notes (Signed)
Pt reports LH and dizziness onset x3 days associated w/back pain and having the "shakes"  Reports his friend was seen here and treated for exposure to black mold... He lives w/friend  Hx of siezures  A&O x4... NAD

## 2016-09-16 NOTE — ED Provider Notes (Signed)
[facility]    CSN: [id number] Arrival date & time: 09/16/16  1346     History   Chief Complaint Chief Complaint  Patient presents with  . Dizziness    HPI Lee Austin is a 30 y.o. male.   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 He's had 48 hours of malaise, intermittent diarrhea, lightheadedness, fatigue, and myalgia.  Patient denies vomiting, sore throat, cough, abdominal pain.  There are no localizing symptoms.  Patient has a past medical history including HIV disease and seizure disorder.      Past Medical History:  Diagnosis Date  . Asthma   . HIV (human immunodeficiency virus infection) (HCC)   . Seizures (HCC)     There are no active problems to display for this patient.   History reviewed. No pertinent surgical history.     Home Medications    Prior to Admission medications   Medication Sig Start Date End Date Taking? Authorizing Provider  abacavir-dolutegravir-lamiVUDine (TRIUMEQ) 600-50-300 MG tablet Take 1 tablet by mouth daily. 11/15/15  Yes Historical Provider, MD  gabapentin (NEURONTIN) 100 MG capsule Take 200 mg by mouth 3 (three) times daily. 07/31/15  Yes Historical Provider, MD  ibuprofen (ADVIL,MOTRIN) 600 MG tablet Take 1 tablet (600 mg total) by mouth every 6 (six) hours as needed for headache or moderate pain. 05/14/16  Yes Loren Raceravid Yelverton, MD  levETIRAcetam (KEPPRA) 500 MG tablet Take 500 mg by mouth 2 (two) times daily. 07/31/15  Yes Historical Provider, MD  nortriptyline (PAMELOR) 50 MG capsule Take 100 mg by mouth at bedtime. 07/31/15  Yes Historical Provider, MD  albuterol (PROAIR HFA) 108 (90 Base) MCG/ACT inhaler Inhale 2 puffs into the lungs every 6 (six) hours as needed for shortness of breath.  10/09/15   Historical Provider, MD  cyclobenzaprine (FLEXERIL) 10 MG tablet Take 1 tablet (10 mg total) by mouth 3 (three) times daily as needed for muscle spasms. 12/23/15   Mercedes Camprubi-Soms, PA-C  fluticasone  (FLONASE) 50 MCG/ACT nasal spray Place 1 spray into both nostrils daily. 05/14/16   Loren Raceravid Yelverton, MD  loratadine (CLARITIN) 10 MG tablet Take 1 tablet (10 mg total) by mouth daily. 05/14/16   Loren Raceravid Yelverton, MD    Family History History reviewed. No pertinent family history.  Social History Social History  Substance Use Topics  . Smoking status: Current Every Day Smoker    Types: Cigarettes  . Smokeless tobacco: Never Used  . Alcohol use Yes     Allergies   Bee venom   Review of Systems Review of Systems  Constitutional: Positive for appetite change, chills, diaphoresis and fatigue. Negative for fever.  HENT: Negative for congestion, dental problem, drooling, ear discharge, ear pain, facial swelling, hearing loss, mouth sores, nosebleeds, postnasal drip, sinus pressure, sore throat, tinnitus and trouble swallowing.   Eyes: Negative.   Respiratory: Negative for cough and chest tightness.   Cardiovascular: Negative.   Gastrointestinal: Positive for diarrhea and nausea.  Endocrine: Negative.   Genitourinary: Negative.   Musculoskeletal: Positive for myalgias.  Neurological: Positive for dizziness and weakness.     Physical Exam Triage Vital Signs ED Triage Vitals  Enc Vitals Group     BP 09/16/16 1421 127/81     Pulse Rate 09/16/16 1421 70     Resp 09/16/16 1421 16     Temp 09/16/16 1421 97.9 F (36.6 C)     Temp Source 09/16/16 1421 Oral     SpO2 09/16/16 1421 100 %  Weight --      Height --      Head Circumference --      Peak Flow --      Pain Score 09/16/16 1427 5     Pain Loc --      Pain Edu? --      Excl. in GC? --    No data found.   Updated Vital Signs BP 127/81 (BP Location: Left Arm)   Pulse 70   Temp 97.9 F (36.6 C) (Oral)   Resp 16   SpO2 100%    Physical Exam  Constitutional: He is oriented to person, place, and time. He appears well-developed and well-nourished. No distress.  HENT:  Head: Normocephalic and atraumatic.  Right  Ear: External ear normal.  Left Ear: External ear normal.  Mouth/Throat: Oropharynx is clear and moist.  Eyes: Conjunctivae and EOM are normal. Pupils are equal, round, and reactive to light.  Neck: Normal range of motion. Neck supple.  Cardiovascular: Normal rate, regular rhythm, normal heart sounds and intact distal pulses.   Pulmonary/Chest: Effort normal and breath sounds normal.  Abdominal: Soft. Bowel sounds are normal. He exhibits no mass. There is no tenderness. There is no guarding.  Musculoskeletal: Normal range of motion.  Neurological: He is oriented to person, place, and time.  Patient appears somewhat lethargic  Skin: Skin is warm and dry.  Nursing note and vitals reviewed.    UC Treatments / Results  Labs (all labs ordered are listed, but only abnormal results are displayed) Labs Reviewed  CBC WITH DIFFERENTIAL/PLATELET  POCT URINALYSIS DIP (DEVICE)    EKG  EKG Interpretation None       Radiology No results found.  Procedures Procedures (including critical care time)  Medications Ordered in UC Medications - No data to display   Initial Impression / Assessment and Plan / UC Course  I have reviewed the triage vital signs and the nursing notes.  Pertinent labs & imaging results that were available during my care of the patient were reviewed by me and considered in my medical decision making (see chart for details).  Clinical Course     Final Clinical Impressions(s) / UC Diagnoses   Final diagnoses:  None  These symptoms are vague and difficult to nail down. There is no focal symptomatology or findings on exam that suggest a serious problem. Most likely, the problem is viral.  New Prescriptions Current Discharge Medication List       Elvina Sidle, MD 09/16/16 1623

## 2016-09-17 ENCOUNTER — Encounter (HOSPITAL_COMMUNITY): Payer: Self-pay | Admitting: Emergency Medicine

## 2016-09-17 ENCOUNTER — Emergency Department (HOSPITAL_COMMUNITY)
Admission: EM | Admit: 2016-09-17 | Discharge: 2016-09-17 | Disposition: A | Discharge status: Home / Self Care | Payer: Self-pay | Attending: Emergency Medicine | Admitting: Emergency Medicine

## 2016-09-17 ENCOUNTER — Emergency Department (HOSPITAL_COMMUNITY): Payer: Self-pay

## 2016-09-17 DIAGNOSIS — R6889 Other general symptoms and signs: Secondary | ICD-10-CM

## 2016-09-17 DIAGNOSIS — J069 Acute upper respiratory infection, unspecified: Secondary | ICD-10-CM | POA: Insufficient documentation

## 2016-09-17 DIAGNOSIS — F17210 Nicotine dependence, cigarettes, uncomplicated: Secondary | ICD-10-CM | POA: Insufficient documentation

## 2016-09-17 DIAGNOSIS — R197 Diarrhea, unspecified: Secondary | ICD-10-CM | POA: Insufficient documentation

## 2016-09-17 DIAGNOSIS — R110 Nausea: Secondary | ICD-10-CM | POA: Insufficient documentation

## 2016-09-17 DIAGNOSIS — J45909 Unspecified asthma, uncomplicated: Secondary | ICD-10-CM | POA: Insufficient documentation

## 2016-09-17 MED ORDER — IBUPROFEN 600 MG PO TABS: 600.0000 mg | ORAL_TABLET | Freq: Four times a day (QID) | ORAL | 0 refills | Status: DC | PRN

## 2016-09-17 MED ORDER — IBUPROFEN 400 MG PO TABS
600.0000 mg | ORAL_TABLET | Freq: Once | ORAL | Status: AC
  Administered 2016-09-17: 600 mg via ORAL
  Filled 2016-09-17: qty 1

## 2016-09-17 MED ORDER — ONDANSETRON 4 MG PO TBDP: 4.0000 mg | ORAL_TABLET | Freq: Three times a day (TID) | ORAL | 0 refills | Status: DC | PRN

## 2016-09-17 NOTE — ED Provider Notes (Signed)
[facility] Provider Note   CSN: [id number] Arrival date & time: 09/17/16  1741     History   Chief Complaint Chief Complaint  Patient presents with  . URI    HPI Lee Austin is a 30 y.o. male.  HPI   Patient is a 30 year old male with a history of asthma, seizures, HIV on antiviral treatment who presents the emergency department with 3 days of mild frontal headache, sinus congestion, myalgia, dry cough, chills, intermittent watery diarrhea, nausea, anorexia with one episode of emesis today. Associated abdominal cramping that is relieved after having a bowel movement.Patient states his roommate had similar symptoms. Patient has his roommate was diagnosed with black mold poisoning. He states his T4 and viral load were normal 2 months ago. Patient denies visual changes, syncope, seizures, numbness/timing, weakness, hematemesis, hematochezia, black tarry stools.   Past Medical History:  Diagnosis Date  . Asthma   . HIV (human immunodeficiency virus infection) (HCC)   . Seizures (HCC)     There are no active problems to display for this patient.   History reviewed. No pertinent surgical history.     Home Medications    Prior to Admission medications   Medication Sig Start Date End Date Taking? Authorizing Provider  abacavir-dolutegravir-lamiVUDine (TRIUMEQ) 600-50-300 MG tablet Take 1 tablet by mouth daily. 11/15/15  Yes Historical Provider, MD  acetaminophen (TYLENOL) 500 MG tablet Take 500 mg by mouth every 6 (six) hours as needed for mild pain.   Yes Historical Provider, MD  albuterol (PROAIR HFA) 108 (90 Base) MCG/ACT inhaler Inhale 2 puffs into the lungs every 6 (six) hours as needed for shortness of breath.  10/09/15  Yes Historical Provider, MD  gabapentin (NEURONTIN) 100 MG capsule Take 200 mg by mouth 3 (three) times daily. 07/31/15  Yes Historical Provider, MD  levETIRAcetam (KEPPRA) 500 MG tablet Take 500 mg by mouth 2 (two) times daily. 07/31/15  Yes  Historical Provider, MD  nortriptyline (PAMELOR) 50 MG capsule Take 100 mg by mouth at bedtime. 07/31/15  Yes Historical Provider, MD  cyclobenzaprine (FLEXERIL) 10 MG tablet Take 1 tablet (10 mg total) by mouth 3 (three) times daily as needed for muscle spasms. Patient not taking: Reported on 09/17/2016 12/23/15   Mercedes Camprubi-Soms, PA-C  fluticasone (FLONASE) 50 MCG/ACT nasal spray Place 1 spray into both nostrils daily. Patient not taking: Reported on 09/17/2016 05/14/16   Loren Racer, MD  ibuprofen (ADVIL,MOTRIN) 600 MG tablet Take 1 tablet (600 mg total) by mouth every 6 (six) hours as needed. 09/17/16   Jerre Simon, PA  loratadine (CLARITIN) 10 MG tablet Take 1 tablet (10 mg total) by mouth daily. Patient not taking: Reported on 09/17/2016 05/14/16   Loren Racer, MD  ondansetron (ZOFRAN ODT) 4 MG disintegrating tablet Take 1 tablet (4 mg total) by mouth every 8 (eight) hours as needed for nausea or vomiting. 09/17/16   Jerre Simon, PA    Family History History reviewed. No pertinent family history.  Social History Social History  Substance Use Topics  . Smoking status: Current Every Day Smoker    Types: Cigarettes  . Smokeless tobacco: Never Used  . Alcohol use Yes     Comment: "special occasions"     Allergies   Bee venom   Review of Systems Review of Systems  Constitutional: Positive for chills and diaphoresis. Negative for fever.  HENT: Positive for congestion, rhinorrhea and sinus pressure. Negative for sore throat and trouble swallowing.   Eyes: Negative for  visual disturbance.  Respiratory: Negative for chest tightness and shortness of breath.   Gastrointestinal: Positive for abdominal pain (cramping), diarrhea, nausea and vomiting.  Genitourinary: Negative for dysuria and hematuria.  Musculoskeletal: Positive for myalgias. Negative for back pain.  Skin: Negative for rash.  Allergic/Immunologic: Positive for immunocompromised state.  Neurological:  Positive for headaches. Negative for dizziness, syncope, weakness and numbness.  Psychiatric/Behavioral: Negative for confusion.     Physical Exam Updated Vital Signs BP 128/86 (BP Location: Left Arm)   Pulse 71   Temp 98.2 F (36.8 C) (Oral)   Resp 18   SpO2 99%   Physical Exam  Constitutional: He appears well-developed and well-nourished. No distress.  HENT:  Head: Normocephalic and atraumatic.  Nose: Mucosal edema and rhinorrhea present.  Mouth/Throat: Uvula is midline and mucous membranes are normal. No trismus in the jaw. No uvula swelling. Posterior oropharyngeal erythema present. No oropharyngeal exudate, posterior oropharyngeal edema or tonsillar abscesses.  Eyes: Conjunctivae are normal.  Neck: Normal range of motion. Neck supple.  Cardiovascular: Normal rate, regular rhythm and normal heart sounds.   Pulmonary/Chest: Effort normal and breath sounds normal. No respiratory distress. He has no decreased breath sounds. He has no wheezes. He has no rhonchi. He has no rales.  Abdominal: Soft. Normal appearance and bowel sounds are normal. He exhibits no distension. There is tenderness in the periumbilical area. There is no tenderness at McBurney's point and negative Murphy's sign.  Musculoskeletal: Normal range of motion.  Neurological: He is alert. Coordination normal.  Skin: Skin is warm and dry. He is not diaphoretic.  Psychiatric: He has a normal mood and affect. His behavior is normal.  Nursing note and vitals reviewed.    ED Treatments / Results  Labs (all labs ordered are listed, but only abnormal results are displayed) Labs Reviewed - No data to display  EKG  EKG Interpretation None       Radiology Dg Chest 2 View  Result Date: 09/17/2016 CLINICAL DATA:  Central chest pain, congestion, and headache. Cough for 3 days. EXAM: CHEST  2 VIEW COMPARISON:  Two-view chest x-ray a 05/14/2016 FINDINGS: The heart size and mediastinal contours are within normal  limits. Both lungs are clear. The visualized skeletal structures are unremarkable. IMPRESSION: Negative two view chest x-ray Electronically Signed   By: Marin Robertshristopher  Mattern M.D.   On: 09/17/2016 20:30    Procedures Procedures (including critical care time)  Medications Ordered in ED Medications  ibuprofen (ADVIL,MOTRIN) tablet 600 mg (600 mg Oral Given 09/17/16 2003)     Initial Impression / Assessment and Plan / ED Course  I have reviewed the triage vital signs and the nursing notes.  Pertinent labs & imaging results that were available during my care of the patient were reviewed by me and considered in my medical decision making (see chart for details).  Clinical Course   Patient with flulike symptoms. Patient is afebrile, VSS, no acute distress. Likely upper URI. No antibiotics indicated at this time. Chest x-ray reviewed by me was negative for any acute abnormalities or infiltrates. Patients symptoms are consistent with URI, likely viral etiology. Discussed that antibiotics are not indicated for viral infections.Patient be discharged with ibuprofen and Zofran. Discussed fluid intake and rest. Discussed strict return precautions to the ED.  Pt is hemodynamically stable & in NAD prior to dc.Patient's resolution and to the discharge instructions.    Final Clinical Impressions(s) / ED Diagnoses   Final diagnoses:  Upper respiratory tract infection, unspecified type  Diarrhea,  unspecified type  Nausea  Flu-like symptoms    New Prescriptions Discharge Medication List as of 09/17/2016  9:15 PM    START taking these medications   Details  ondansetron (ZOFRAN ODT) 4 MG disintegrating tablet Take 1 tablet (4 mg total) by mouth every 8 (eight) hours as needed for nausea or vomiting., Starting Wed 09/17/2016, Print         Joyce Copa [place], [place] 09/19/16 1518    Shaune Pollack, MD 09/19/16 Ernestina [place]

## 2016-09-17 NOTE — Discharge Instructions (Signed)
Your chest x-ray was negative for any acute abnormalities. Take the ibuprofen as prescribed to be sure to eat with this medication as it can be hard in your stomach. Take over-the-counter Zantac for your acid reflux. Take Zofran as needed for nausea. Drink plenty of fluids and rest. Follow up here at the ED or [facility] in 3 days if your symptoms are not improving.  Return immediately to the emergency department if you experience fever, vomiting, worsening diarrhea, abdominal pain, worsening headache, dizziness, you pass out, or any other concerning symptoms.

## 2016-09-17 NOTE — ED Triage Notes (Signed)
Pt presents to ED for assessment of generalized body aches, congestion, headache, abdominal cramping and other "flu-like symptoms".  [facility] and diagnosed with a "viral infection" but "they didn't tell me what kind of virus, or give me antibiotics, or anything".  Pt was not given any medications to help with symptoms.  Pt sts he could not make it through work today.

## 2016-09-17 NOTE — ED Notes (Signed)
Pt is in stable condition upon d/c and ambulates from ED. 

## 2016-10-24 ENCOUNTER — Ambulatory Visit (HOSPITAL_COMMUNITY)
Admission: EM | Admit: 2016-10-24 | Discharge: 2016-10-24 | Disposition: A | Discharge status: Home / Self Care | Payer: Self-pay | Attending: Internal Medicine | Admitting: Internal Medicine

## 2016-10-24 ENCOUNTER — Emergency Department (HOSPITAL_COMMUNITY): Admission: EM | Admit: 2016-10-24 | Discharge: 2016-10-24 | Discharge status: Against Medical Advice | Payer: Self-pay

## 2016-10-24 ENCOUNTER — Encounter (HOSPITAL_COMMUNITY): Payer: Self-pay | Admitting: Emergency Medicine

## 2016-10-24 DIAGNOSIS — B9689 Other specified bacterial agents as the cause of diseases classified elsewhere: Secondary | ICD-10-CM

## 2016-10-24 DIAGNOSIS — J019 Acute sinusitis, unspecified: Secondary | ICD-10-CM

## 2016-10-24 MED ORDER — AMOXICILLIN-POT CLAVULANATE 875-125 MG PO TABS: 1.0000 | ORAL_TABLET | Freq: Two times a day (BID) | ORAL | 0 refills | Status: AC

## 2016-10-24 MED ORDER — BENZONATATE 200 MG PO CAPS: 200.0000 mg | ORAL_CAPSULE | Freq: Three times a day (TID) | ORAL | 1 refills | Status: DC | PRN

## 2016-10-24 MED ORDER — PREDNISONE 50 MG PO TABS: 50.0000 mg | ORAL_TABLET | Freq: Every day | ORAL | 0 refills | Status: DC

## 2016-10-24 NOTE — Discharge Instructions (Addendum)
[facility] [address] [facility].  Recheck for increasing phlegm production/nasal discharge, new fever >100.5, or if not starting to improve in a few days.

## 2016-10-24 NOTE — ED Triage Notes (Signed)
Here for cold sx onset 4 days associated w/facial pressure, HA, nasal congestion  Denies fevers  A&O 4... NAD  Taking acetaminophen w/temp relief and left over Vicodin.

## 2016-10-24 NOTE — ED Provider Notes (Signed)
[facility]    CSN: [id number] Arrival date & time: 10/24/16  1835     History   Chief Complaint Chief Complaint  Patient presents with  . URI    HPI Particia JasperDarnell Bodin is a 30 y.o. male. He presents today with a couple days history of headache, achiness, severe sinus congestion/pressure, coughing spells. These are mostly nonproductive. He has nausea, no vomiting, no diarrhea. Appetite is poor. No fever but did have some tactile temperature at work. 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 He had to call out sick today.   HPI  Past Medical History:  Diagnosis Date  . Asthma   . HIV (human immunodeficiency virus infection) (HCC)   . Seizures (HCC)     History reviewed. No pertinent surgical history.     Home Medications    Prior to Admission medications   Medication Sig Start Date End Date Taking? Authorizing Provider  abacavir-dolutegravir-lamiVUDine (TRIUMEQ) 600-50-300 MG tablet Take 1 tablet by mouth daily. 11/15/15  Yes Historical Provider, MD  acetaminophen (TYLENOL) 500 MG tablet Take 500 mg by mouth every 6 (six) hours as needed for mild pain.   Yes Historical Provider, MD  albuterol (PROAIR HFA) 108 (90 Base) MCG/ACT inhaler Inhale 2 puffs into the lungs every 6 (six) hours as needed for shortness of breath.  10/09/15  Yes Historical Provider, MD  fluticasone (FLONASE) 50 MCG/ACT nasal spray Place 1 spray into both nostrils daily. 05/14/16  Yes Loren Raceravid Yelverton, MD  gabapentin (NEURONTIN) 100 MG capsule Take 200 mg by mouth 3 (three) times daily. 07/31/15  Yes Historical Provider, MD  levETIRAcetam (KEPPRA) 500 MG tablet Take 500 mg by mouth 2 (two) times daily. 07/31/15  Yes Historical Provider, MD  nortriptyline (PAMELOR) 50 MG capsule Take 100 mg by mouth at bedtime. 07/31/15  Yes Historical Provider, MD  ondansetron (ZOFRAN ODT) 4 MG disintegrating tablet Take 1 tablet (4 mg total) by mouth every 8 (eight) hours  as needed for nausea or vomiting. 09/17/16  Yes Jessica L Focht, PA  amoxicillin-clavulanate (AUGMENTIN) 875-125 MG tablet Take 1 tablet by mouth 2 (two) times daily. 10/24/16 11/03/16  Eustace MooreLaura W Murray, MD  benzonatate (TESSALON) 200 MG capsule Take 1 capsule (200 mg total) by mouth 3 (three) times daily as needed for cough. 10/24/16   Eustace MooreLaura W Murray, MD  ibuprofen (ADVIL,MOTRIN) 600 MG tablet Take 1 tablet (600 mg total) by mouth every 6 (six) hours as needed. 09/17/16   Jerre SimonJessica L Focht, PA  predniSONE (DELTASONE) 50 MG tablet Take 1 tablet (50 mg total) by mouth daily. 10/24/16   Eustace MooreLaura W Murray, MD    Family History History reviewed. No pertinent family history.  Social History Social History  Substance Use Topics  . Smoking status: Current Every Day Smoker    Types: Cigarettes  . Smokeless tobacco: Never Used  . Alcohol use Yes     Comment: "special occasions"     Allergies   Bee venom   Review of Systems Review of Systems  All other systems reviewed and are negative.    Physical Exam Triage Vital Signs ED Triage Vitals  Enc Vitals Group     BP 10/24/16 1909 114/75     Pulse Rate 10/24/16 1909 90     Resp 10/24/16 1909 16     Temp 10/24/16 1909 98.6 F (37 C)     Temp Source 10/24/16 1909 Oral  SpO2 10/24/16 1909 98 %     Weight --      Height --      Pain Score 10/24/16 1912 7   Updated Vital Signs BP 114/75 (BP Location: Right Arm)   Pulse 90   Temp 98.6 F (37 C) (Oral)   Resp 16   SpO2 98%  Physical Exam  Constitutional: He is oriented to person, place, and time. He appears distressed.  Alert, nicely groomed Looks ill but not toxic Sitting up on the end of the exam table  HENT:  Head: Atraumatic.  Bilateral ears are waxy Marked nasal congestion Throat is quite red with postnasal drainage  Eyes:  Conjugate gaze, no eye redness/drainage  Neck: Neck supple.  Cardiovascular: Normal rate and regular rhythm.   Pulmonary/Chest: No respiratory  distress. He has no wheezes. He has no rales.  Lungs clear, symmetric breath sounds  Abdominal: He exhibits no distension.  Musculoskeletal: Normal range of motion.  Neurological: He is alert and oriented to person, place, and time.  Skin: Skin is warm and dry.  No cyanosis  Nursing note and vitals reviewed.    UC Treatments / Results   Procedures Procedures (including critical care time) None today  Final Clinical Impressions(s) / UC Diagnoses   Final diagnoses:  Acute bacterial sinusitis   Prescriptions were sent to the [facility] on [address] [facility] [place].  Recheck for increasing phlegm production/nasal discharge, new fever >100.5, or if not starting to improve in a few days.    New Prescriptions New Prescriptions   AMOXICILLIN-CLAVULANATE (AUGMENTIN) 875-125 MG TABLET    Take 1 tablet by mouth 2 (two) times daily.   BENZONATATE (TESSALON) 200 MG CAPSULE    Take 1 capsule (200 mg total) by mouth 3 (three) times daily as needed for cough.   PREDNISONE (DELTASONE) 50 MG TABLET    Take 1 tablet (50 mg total) by mouth daily.     Eustace MooreLaura W Murray, MD 10/27/16 1329

## 2016-10-24 NOTE — ED Triage Notes (Signed)
Pt left before being triaged and is at urgent care now

## 2016-10-27 ENCOUNTER — Ambulatory Visit (INDEPENDENT_AMBULATORY_CARE_PROVIDER_SITE_OTHER): Payer: Self-pay

## 2016-10-27 ENCOUNTER — Encounter (HOSPITAL_COMMUNITY): Payer: Self-pay | Admitting: Emergency Medicine

## 2016-10-27 ENCOUNTER — Ambulatory Visit (HOSPITAL_COMMUNITY)
Admission: EM | Admit: 2016-10-27 | Discharge: 2016-10-27 | Disposition: A | Discharge status: Home / Self Care | Payer: Self-pay | Attending: Emergency Medicine | Admitting: Emergency Medicine

## 2016-10-27 DIAGNOSIS — R0602 Shortness of breath: Secondary | ICD-10-CM

## 2016-10-27 MED ORDER — PREDNISONE 10 MG (21) PO TBPK: 10.0000 mg | ORAL_TABLET | Freq: Every day | ORAL | 0 refills | Status: DC

## 2016-10-27 NOTE — ED Triage Notes (Addendum)
PT was seen Friday for a sinus infection. He has been taking antibiotics. PT reports today he has had exertional SOB. PT is worried about pneumonia. PT is HIV positive. PT also reports chest pain that is worse with deep breaths

## 2016-10-27 NOTE — ED Provider Notes (Signed)
CSN: [id number]     Arrival date & time 10/27/16  1434 History   None    Chief Complaint  Patient presents with  . Shortness of Breath   (Consider location/radiation/quality/duration/timing/severity/associated sxs/prior Treatment) Pt was seen a week ago and began on abx for a sinus infection. States that he became SOB this morning and was concerned that he aquired pneumonia, Since he has HIV he wanted to have a chest xray to assure that this was not the case. Denies any productive cough. States that he started to feel better while taking the prednisione but he completed the dose. Denies any shect pain, doesn not appear in distress.        Past Medical History:  Diagnosis Date  . Asthma   . HIV (human immunodeficiency virus infection) (HCC)   . Seizures (HCC)    History reviewed. No pertinent surgical history. No family history on file. Social History  Substance Use Topics  . Smoking status: Current Some Day Smoker    Types: Cigarettes  . Smokeless tobacco: Never Used  . Alcohol use Yes     Comment: "special occasions"    Review of Systems  Constitutional: Negative.   HENT: Positive for congestion, postnasal drip, rhinorrhea, sinus pain and sinus pressure.   Eyes: Negative.   Respiratory: Positive for cough.   Cardiovascular: Negative.   Skin: Negative.   Neurological: Negative.     Allergies  Bee venom  Home Medications   Prior to Admission medications   Medication Sig Start Date End Date Taking? Authorizing Provider  abacavir-dolutegravir-lamiVUDine (TRIUMEQ) 600-50-300 MG tablet Take 1 tablet by mouth daily. 11/15/15   Historical Provider, MD  acetaminophen (TYLENOL) 500 MG tablet Take 500 mg by mouth every 6 (six) hours as needed for mild pain.    Historical Provider, MD  albuterol (PROAIR HFA) 108 (90 Base) MCG/ACT inhaler Inhale 2 puffs into the lungs every 6 (six) hours as needed for shortness of breath.  10/09/15   Historical Provider, MD   amoxicillin-clavulanate (AUGMENTIN) 875-125 MG tablet Take 1 tablet by mouth 2 (two) times daily. 10/24/16 11/03/16  Eustace MooreLaura W Murray, MD  benzonatate (TESSALON) 200 MG capsule Take 1 capsule (200 mg total) by mouth 3 (three) times daily as needed for cough. 10/24/16   Eustace MooreLaura W Murray, MD  fluticasone (FLONASE) 50 MCG/ACT nasal spray Place 1 spray into both nostrils daily. 05/14/16   Loren Raceravid Yelverton, MD  gabapentin (NEURONTIN) 100 MG capsule Take 200 mg by mouth 3 (three) times daily. 07/31/15   Historical Provider, MD  ibuprofen (ADVIL,MOTRIN) 600 MG tablet Take 1 tablet (600 mg total) by mouth every 6 (six) hours as needed. 09/17/16   Jerre SimonJessica L Focht, PA  levETIRAcetam (KEPPRA) 500 MG tablet Take 500 mg by mouth 2 (two) times daily. 07/31/15   Historical Provider, MD  nortriptyline (PAMELOR) 50 MG capsule Take 100 mg by mouth at bedtime. 07/31/15   Historical Provider, MD  ondansetron (ZOFRAN ODT) 4 MG disintegrating tablet Take 1 tablet (4 mg total) by mouth every 8 (eight) hours as needed for nausea or vomiting. 09/17/16   Jerre SimonJessica L Focht, PA  predniSONE (STERAPRED UNI-PAK 21 TAB) 10 MG (21) TBPK tablet Take 1 tablet (10 mg total) by mouth daily. Take 6 tabs by mouth daily  for 2 days, then 5 tabs for 2 days, then 4 tabs for 2 days, then 3 tabs for 2 days, 2 tabs for 2 days, then 1 tab by mouth daily for 2 days 10/27/16  Tobi BastosMelanie A Rosabel Sermeno, NP   Meds Ordered and Administered this Visit  Medications - No data to display  BP 115/62   Pulse 70   Temp 98.6 F (37 C) (Oral)   Resp 16   Ht 6\' 1"  (1.854 m)   Wt 172 lb (78 kg)   SpO2 100%   BMI 22.69 kg/m  No data found.   Physical Exam  Constitutional: He appears well-developed and well-nourished.  HENT:  Mouth/Throat: No oropharyngeal exudate.  Eyes: Pupils are equal, round, and reactive to light.  Neck: Normal range of motion.  Cardiovascular: Normal rate and regular rhythm.   Pulmonary/Chest: Effort normal and breath sounds normal.  Non  productive cough , sob intermit   Abdominal: Soft. Bowel sounds are normal.  Neurological: He is alert.  Skin: Skin is warm.    Urgent Care Course   Clinical Course     Procedures (including critical care time)  Labs Review Labs Reviewed - No data to display  Imaging Review Dg Chest 2 View  Result Date: 10/27/2016 CLINICAL DATA:  Short of breath.  HIV EXAM: CHEST  2 VIEW COMPARISON:  09/17/2016 FINDINGS: The heart size and mediastinal contours are within normal limits. Both lungs are clear. The visualized skeletal structures are unremarkable. IMPRESSION: No active cardiopulmonary disease. Electronically Signed   By: Marlan Palauharles  Clark M.D.   On: 10/27/2016 16:54              MDM   1. SOB (shortness of breath)    discussed that it may take up to 2 weeks for cough to go away. He stats that he felt better on prednisone , we will continue to dose and he can take OTC cough meds.  GO to the ER if sx worsen or begin having chest pain     Tobi BastosMelanie A Jameis Newsham, NP 10/28/16 1130

## 2016-12-29 ENCOUNTER — Ambulatory Visit (HOSPITAL_COMMUNITY): Admission: EM | Admit: 2016-12-29 | Discharge: 2016-12-29 | Discharge status: Against Medical Advice | Payer: Self-pay

## 2017-01-13 ENCOUNTER — Encounter (HOSPITAL_COMMUNITY): Payer: Self-pay | Admitting: Emergency Medicine

## 2017-01-13 DIAGNOSIS — J45909 Unspecified asthma, uncomplicated: Secondary | ICD-10-CM | POA: Diagnosis not present

## 2017-01-13 DIAGNOSIS — Z79899 Other long term (current) drug therapy: Secondary | ICD-10-CM | POA: Insufficient documentation

## 2017-01-13 DIAGNOSIS — R1084 Generalized abdominal pain: Secondary | ICD-10-CM | POA: Diagnosis not present

## 2017-01-13 DIAGNOSIS — R112 Nausea with vomiting, unspecified: Secondary | ICD-10-CM | POA: Diagnosis not present

## 2017-01-13 DIAGNOSIS — F17210 Nicotine dependence, cigarettes, uncomplicated: Secondary | ICD-10-CM | POA: Insufficient documentation

## 2017-01-13 DIAGNOSIS — R197 Diarrhea, unspecified: Secondary | ICD-10-CM | POA: Diagnosis not present

## 2017-01-13 LAB — URINALYSIS, ROUTINE W REFLEX MICROSCOPIC
BILIRUBIN URINE: NEGATIVE
GLUCOSE, UA: NEGATIVE mg/dL
Hgb urine dipstick: NEGATIVE
Ketones, ur: NEGATIVE mg/dL
Leukocytes, UA: NEGATIVE
Nitrite: NEGATIVE
PH: 7 (ref 5.0–8.0)
Protein, ur: NEGATIVE mg/dL
SPECIFIC GRAVITY, URINE: 1.015 (ref 1.005–1.030)

## 2017-01-13 NOTE — ED Triage Notes (Signed)
Patient arrives with complaint of vomiting, diarrhea, and abdominal pain. Onset this AM around 0500. Patient attempted to go on with his daily routine, but could not continue. 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

## 2017-01-14 ENCOUNTER — Encounter (HOSPITAL_COMMUNITY): Payer: Self-pay

## 2017-01-14 ENCOUNTER — Emergency Department (HOSPITAL_COMMUNITY): Payer: PRIVATE HEALTH INSURANCE

## 2017-01-14 ENCOUNTER — Emergency Department (HOSPITAL_COMMUNITY)
Admission: EM | Admit: 2017-01-14 | Discharge: 2017-01-14 | Disposition: A | Discharge status: Home / Self Care | Payer: PRIVATE HEALTH INSURANCE | Attending: Emergency Medicine | Admitting: Emergency Medicine

## 2017-01-14 DIAGNOSIS — R197 Diarrhea, unspecified: Secondary | ICD-10-CM

## 2017-01-14 DIAGNOSIS — R1084 Generalized abdominal pain: Secondary | ICD-10-CM

## 2017-01-14 DIAGNOSIS — R112 Nausea with vomiting, unspecified: Secondary | ICD-10-CM

## 2017-01-14 LAB — CBC
HCT: 39.8 % (ref 39.0–52.0)
Hemoglobin: 13.3 g/dL (ref 13.0–17.0)
MCH: 32.1 pg (ref 26.0–34.0)
MCHC: 33.4 g/dL (ref 30.0–36.0)
MCV: 96.1 fL (ref 78.0–100.0)
Platelets: 239 K/uL (ref 150–400)
RBC: 4.14 MIL/uL — ABNORMAL LOW (ref 4.22–5.81)
RDW: 12.4 % (ref 11.5–15.5)
WBC: 6 K/uL (ref 4.0–10.5)

## 2017-01-14 LAB — COMPREHENSIVE METABOLIC PANEL WITH GFR
ALT: 17 U/L (ref 17–63)
AST: 18 U/L (ref 15–41)
Albumin: 4 g/dL (ref 3.5–5.0)
Alkaline Phosphatase: 50 U/L (ref 38–126)
Anion gap: 7 (ref 5–15)
BUN: 9 mg/dL (ref 6–20)
CO2: 28 mmol/L (ref 22–32)
Calcium: 9.7 mg/dL (ref 8.9–10.3)
Chloride: 105 mmol/L (ref 101–111)
Creatinine, Ser: 1.1 mg/dL (ref 0.61–1.24)
GFR calc Af Amer: >60 mL/min
GFR calc non Af Amer: >60 mL/min
Glucose, Bld: 91 mg/dL (ref 65–99)
Potassium: 3.7 mmol/L (ref 3.5–5.1)
Sodium: 140 mmol/L (ref 135–145)
Total Bilirubin: 0.8 mg/dL (ref 0.3–1.2)
Total Protein: 6.9 g/dL (ref 6.5–8.1)

## 2017-01-14 LAB — LIPASE, BLOOD: Lipase: 27 U/L (ref 11–51)

## 2017-01-14 MED ORDER — IOPAMIDOL (ISOVUE-300) INJECTION 61%
INTRAVENOUS | Status: AC
  Administered 2017-01-14: 100 mL
  Filled 2017-01-14: qty 100

## 2017-01-14 MED ORDER — ONDANSETRON HCL 4 MG/2ML IJ SOLN
4.0000 mg | Freq: Once | INTRAMUSCULAR | Status: AC
Start: 2017-01-14 — End: 2017-01-14
  Administered 2017-01-14: 4 mg via INTRAVENOUS
  Filled 2017-01-14: qty 2

## 2017-01-14 MED ORDER — ONDANSETRON HCL 4 MG PO TABS: 4.0000 mg | ORAL_TABLET | Freq: Four times a day (QID) | ORAL | 0 refills | Status: AC

## 2017-01-14 MED ORDER — SODIUM CHLORIDE 0.9 % IV BOLUS (SEPSIS)
1000.0000 mL | Freq: Once | INTRAVENOUS | Status: AC
  Administered 2017-01-14: 1000 mL via INTRAVENOUS

## 2017-01-14 MED ORDER — DIPHENOXYLATE-ATROPINE 2.5-0.025 MG PO TABS: 2.0000 | ORAL_TABLET | Freq: Four times a day (QID) | ORAL | 0 refills | Status: DC | PRN

## 2017-01-14 NOTE — ED Notes (Signed)
Patient transported to CT 

## 2017-01-14 NOTE — ED Provider Notes (Signed)
[facility] Note   CSN: [id number] Arrival date & time: 01/13/17  2313  By signing my name below, I, Lee Austin, attest that this documentation has been prepared under the direction and in the presence of Lee Crease, MD.  Electronically Signed: Octavia Austin, ED Scribe. 01/14/17. 2:12 AM.    History   Chief Complaint Chief Complaint  Patient presents with  . Emesis  . Diarrhea  . Bloated   The history is provided by the patient. No language interpreter was used.   HPI Comments: Lee Austin is a 31 y.o. male who presents to the Emergency Department complaining of moderate, persisting, RUQ abdominal cramping that began ~ 12 hours ago. He notes associated vomiting, diarrhea x 1, and abdominal "bloating". Pt reports being able to go to his first job this morning tolerating the pain but says he could not continue to his work due to increased pain. Pt says that he had URI symptoms last week and is still currently getting over his symptoms. He has no abdominal surgical hx. Pt denies fever.   Past Medical History:  Diagnosis Date  . Asthma   . HIV (human immunodeficiency virus infection) (HCC)   . Seizures (HCC)     There are no active problems to display for this patient.   Past Surgical History:  Procedure Laterality Date  . KNEE SURGERY Left        Home Medications    Prior to Admission medications   Medication Sig Start Date End Date Taking? Authorizing Provider  abacavir-dolutegravir-lamiVUDine (TRIUMEQ) 600-50-300 MG tablet Take 1 tablet by mouth daily. 11/15/15   Historical Provider, MD  acetaminophen (TYLENOL) 500 MG tablet Take 500 mg by mouth every 6 (six) hours as needed for mild pain.    Historical Provider, MD  albuterol (PROAIR HFA) 108 (90 Base) MCG/ACT inhaler Inhale 2 puffs into the lungs every 6 (six) hours as needed for shortness of breath.  10/09/15   Historical Provider, MD  benzonatate (TESSALON) 200 MG capsule Take 1  capsule (200 mg total) by mouth 3 (three) times daily as needed for cough. 10/24/16   Lee Moore, MD  diphenoxylate-atropine (LOMOTIL) 2.5-0.025 MG tablet Take 2 tablets by mouth 4 (four) times daily as needed for diarrhea or loose stools. 01/14/17   Lee Crease, MD  fluticasone (FLONASE) 50 MCG/ACT nasal spray Place 1 spray into both nostrils daily. 05/14/16   Loren Racer, MD  gabapentin (NEURONTIN) 100 MG capsule Take 200 mg by mouth 3 (three) times daily. 07/31/15   Historical Provider, MD  ibuprofen (ADVIL,MOTRIN) 600 MG tablet Take 1 tablet (600 mg total) by mouth every 6 (six) hours as needed. 09/17/16   Lee Simon, PA  levETIRAcetam (KEPPRA) 500 MG tablet Take 500 mg by mouth 2 (two) times daily. 07/31/15   Historical Provider, MD  nortriptyline (PAMELOR) 50 MG capsule Take 100 mg by mouth at bedtime. 07/31/15   Historical Provider, MD  ondansetron (ZOFRAN ODT) 4 MG disintegrating tablet Take 1 tablet (4 mg total) by mouth every 8 (eight) hours as needed for nausea or vomiting. 09/17/16   Lee L Focht, PA  ondansetron (ZOFRAN) 4 MG tablet Take 1 tablet (4 mg total) by mouth every 6 (six) hours. 01/14/17   Lee Crease, MD  predniSONE (STERAPRED UNI-PAK 21 TAB) 10 MG (21) TBPK tablet Take 1 tablet (10 mg total) by mouth daily. Take 6 tabs by mouth daily  for 2 days, then 5 tabs for 2  days, then 4 tabs for 2 days, then 3 tabs for 2 days, 2 tabs for 2 days, then 1 tab by mouth daily for 2 days 10/27/16   Lee Bastos, NP    Family History History reviewed. No pertinent family history.  Social History Social History  Substance Use Topics  . Smoking status: Current Some Day Smoker    Types: Cigarettes  . Smokeless tobacco: Never Used  . Alcohol use Yes     Comment: "special occasions"     Allergies   Bee venom   Review of Systems Review of Systems  Constitutional: Negative for fever.  Gastrointestinal: Positive for abdominal distention, abdominal  pain, diarrhea, nausea and vomiting.  All other systems reviewed and are negative.    Physical Exam Updated Vital Signs BP 123/77   Pulse 67   Temp 98.6 F (37 C)   Resp 18   SpO2 100%   Physical Exam  Constitutional: He is oriented to person, place, and time. He appears well-developed and well-nourished. No distress.  HENT:  Head: Normocephalic and atraumatic.  Right Ear: Hearing normal.  Left Ear: Hearing normal.  Nose: Nose normal.  Mouth/Throat: Oropharynx is clear and moist and mucous membranes are normal.  Eyes: Conjunctivae and EOM are normal. Pupils are equal, round, and reactive to light.  Neck: Normal range of motion. Neck supple.  Cardiovascular: Regular rhythm, S1 normal and S2 normal.  Exam reveals no gallop and no friction rub.   No murmur heard. Pulmonary/Chest: Effort normal and breath sounds normal. No respiratory distress. He exhibits no tenderness.  Abdominal: Soft. Normal appearance and bowel sounds are normal. There is no hepatosplenomegaly. There is tenderness. There is guarding. There is no rebound, no tenderness at McBurney's point and negative Murphy's sign. No hernia.  RUQ abdominal pain with voluntary guarding  Musculoskeletal: Normal range of motion.  Neurological: He is alert and oriented to person, place, and time. He has normal strength. No cranial nerve deficit or sensory deficit. Coordination normal. GCS eye subscore is 4. GCS verbal subscore is 5. GCS motor subscore is 6.  Skin: Skin is warm, dry and intact. No rash noted. No cyanosis.  Psychiatric: He has a normal mood and affect. His speech is normal and behavior is normal. Thought content normal.  Nursing note and vitals reviewed.    ED Treatments / Results  DIAGNOSTIC STUDIES: Oxygen Saturation is 99% on RA, normal by my interpretation.  COORDINATION OF CARE:  2:09 AM Discussed treatment plan with pt at bedside and pt agreed to plan.  Labs (all labs ordered are listed, but only  abnormal results are displayed) Labs Reviewed  CBC - Abnormal; Notable for the following:       Result Value   RBC 4.14 (*)    All other components within normal limits  LIPASE, BLOOD  COMPREHENSIVE METABOLIC PANEL  URINALYSIS, ROUTINE W REFLEX MICROSCOPIC    EKG  EKG Interpretation None       Radiology Ct Abdomen Pelvis W Contrast  Result Date: 01/14/2017 CLINICAL DATA:  Right lower quadrant pain, vomiting, and diarrhea. EXAM: CT ABDOMEN AND PELVIS WITH CONTRAST TECHNIQUE: Multidetector CT imaging of the abdomen and pelvis was performed using the standard protocol following bolus administration of intravenous contrast. CONTRAST:  ISOVUE-300 IOPAMIDOL (ISOVUE-300) INJECTION 61% COMPARISON:  None. FINDINGS: Lower chest: Lung bases are clear. Hepatobiliary: No focal liver abnormality is seen. No gallstones, gallbladder wall thickening, or biliary dilatation. Pancreas: Unremarkable. No pancreatic ductal dilatation or surrounding inflammatory  changes. Spleen: Normal in size without focal abnormality. Adrenals/Urinary Tract: Adrenal glands are unremarkable. Kidneys are normal, without renal calculi, focal lesion, or hydronephrosis. Bladder is unremarkable. Stomach/Bowel: Stomach is within normal limits. Appendix appears normal. No evidence of bowel wall thickening, distention, or inflammatory changes. Vascular/Lymphatic: No significant vascular findings are present. No enlarged abdominal or pelvic lymph nodes. Reproductive: Prostate is unremarkable. Other: No abdominal wall hernia or abnormality. No abdominopelvic ascites. Musculoskeletal: No acute or significant osseous findings. IMPRESSION: No acute process demonstrated in the abdomen or pelvis. No evidence of bowel obstruction or inflammation. Electronically Signed   By: Burman NievesWilliam  Stevens M.D.   On: 01/14/2017 03:04    Procedures Procedures (including critical care time)  Medications Ordered in ED Medications  sodium chloride 0.9 %  bolus 1,000 mL (1,000 mLs Intravenous New Bag/Given 01/14/17 0233)  ondansetron (ZOFRAN) injection 4 mg (4 mg Intravenous Given 01/14/17 0233)  iopamidol (ISOVUE-300) 61 % injection (100 mLs  Contrast Given 01/14/17 0247)     Initial Impression / Assessment and Plan / ED Course  I have reviewed the triage vital signs and the nursing notes.  Pertinent labs & imaging results that were available during my care of the patient were reviewed by me and considered in my medical decision making (see chart for details).     Patient presents with complaints of nausea, vomiting, diarrhea with abdominal bloating. Lab work was normal. He is afebrile. He did, however, have isolated right lower quadrant tenderness on examination. CT scan was therefore performed. No evidence of acute appendicitis or other pathology noted. Patient hydrated and given antiemetics in the ER, will continue outpatient treatment.  Final Clinical Impressions(s) / ED Diagnoses   Final diagnoses:  Generalized abdominal pain  Nausea vomiting and diarrhea   I personally performed the services described in this documentation, which was scribed in my presence. The recorded information has been reviewed and is accurate.  New Prescriptions New Prescriptions   DIPHENOXYLATE-ATROPINE (LOMOTIL) 2.5-0.025 MG TABLET    Take 2 tablets by mouth 4 (four) times daily as needed for diarrhea or loose stools.   ONDANSETRON (ZOFRAN) 4 MG TABLET    Take 1 tablet (4 mg total) by mouth every 6 (six) hours.     Lee Creasehristopher J Mikalah Skyles, MD 01/14/17 [id number]

## 2017-03-07 ENCOUNTER — Encounter (HOSPITAL_COMMUNITY): Payer: Self-pay | Admitting: Emergency Medicine

## 2017-03-07 ENCOUNTER — Ambulatory Visit (HOSPITAL_COMMUNITY)
Admission: EM | Admit: 2017-03-07 | Discharge: 2017-03-07 | Disposition: A | Discharge status: Home / Self Care | Payer: PRIVATE HEALTH INSURANCE | Attending: Internal Medicine | Admitting: Internal Medicine

## 2017-03-07 DIAGNOSIS — M25512 Pain in left shoulder: Secondary | ICD-10-CM

## 2017-03-07 MED ORDER — CYCLOBENZAPRINE HCL 10 MG PO TABS: 10.0000 mg | ORAL_TABLET | Freq: Two times a day (BID) | ORAL | 0 refills | Status: AC | PRN

## 2017-03-07 MED ORDER — IBUPROFEN 800 MG PO TABS: 800.0000 mg | ORAL_TABLET | Freq: Three times a day (TID) | ORAL | 0 refills | Status: AC

## 2017-03-07 NOTE — ED Triage Notes (Signed)
Left shoulder and left back pain that started 2 days ago and getting worse.  No known injury.  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

## 2017-03-07 NOTE — ED Provider Notes (Signed)
[facility]    CSN: [id number] Arrival date & time: 03/07/17  1536     History   Chief Complaint Chief Complaint  Patient presents with  . Back Pain    HPI Lee Austin is a 31 y.o. male. 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 Was having a little discomfort in his back, and then since April 4, has had a lot of pain in the posterior left shoulder. Really hurts to raise his arm overhead, although he is able to do it. Not much change in shoulder pain with turning his head. Was able to walk into the urgent care independently, and climb on/off exam table    HPI  Past Medical History:  Diagnosis Date  . Asthma   . HIV (human immunodeficiency virus infection) (HCC)   . Seizures (HCC)     Past Surgical History:  Procedure Laterality Date  . KNEE SURGERY Left        Home Medications    Prior to Admission medications   Medication Sig Start Date End Date Taking? Authorizing Provider  abacavir-dolutegravir-lamiVUDine (TRIUMEQ) 600-50-300 MG tablet Take 1 tablet by mouth daily. 11/15/15   Historical Provider, MD  albuterol (PROAIR HFA) 108 (90 Base) MCG/ACT inhaler Inhale 2 puffs into the lungs every 6 (six) hours as needed for shortness of breath.  10/09/15   Historical Provider, MD  cyclobenzaprine (FLEXERIL) 10 MG tablet Take 1 tablet (10 mg total) by mouth 2 (two) times daily as needed for muscle spasms. 03/07/17   Eustace Moore, MD  fluticasone (FLONASE) 50 MCG/ACT nasal spray Place 1 spray into both nostrils daily. 05/14/16   Loren Racer, MD  gabapentin (NEURONTIN) 100 MG capsule Take 200 mg by mouth 3 (three) times daily. 07/31/15   Historical Provider, MD  ibuprofen (ADVIL,MOTRIN) 600 MG tablet Take 1 tablet (600 mg total) by mouth every 6 (six) hours as needed. 09/17/16   Jerre Simon, PA  ibuprofen (ADVIL,MOTRIN) 800 MG tablet Take 1 tablet (800 mg  total) by mouth 3 (three) times daily. 03/07/17   Eustace Moore, MD  levETIRAcetam (KEPPRA) 500 MG tablet Take 500 mg by mouth 2 (two) times daily. 07/31/15   Historical Provider, MD  nortriptyline (PAMELOR) 50 MG capsule Take 100 mg by mouth at bedtime. 07/31/15   Historical Provider, MD  ondansetron (ZOFRAN) 4 MG tablet Take 1 tablet (4 mg total) by mouth every 6 (six) hours. 01/14/17   Gilda Crease, MD    Family History No family history on file.  Social History Social History  Substance Use Topics  . Smoking status: Current Some Day Smoker    Types: Cigarettes  . Smokeless tobacco: Never Used  . Alcohol use Yes     Comment: "special occasions"     Allergies   Bee venom   Review of Systems Review of Systems  All other systems reviewed and are negative.    Physical Exam Triage Vital Signs ED Triage Vitals  Enc Vitals Group     BP 03/07/17 1613 108/70     Pulse Rate 03/07/17 1613 72     Resp 03/07/17 1613 18     Temp 03/07/17 1613 98.1 F (36.7 C)     Temp Source 03/07/17 1613 Oral     SpO2 03/07/17 1613 100 %     Weight --  Height --      Pain Score 03/07/17 1609 8     Pain Loc --    Updated Vital Signs BP 108/70 (BP Location: Left Arm)   Pulse 72   Temp 98.1 F (36.7 C) (Oral)   Resp 18   SpO2 100%   Physical Exam  Constitutional: He is oriented to person, place, and time. No distress.  Alert, nicely groomed  HENT:  Head: Atraumatic.  Eyes:  Conjugate gaze, no eye redness/drainage  Neck: Neck supple.  Cardiovascular: Normal rate.   Pulmonary/Chest: No respiratory distress.  Abdominal: He exhibits no distension.  Musculoskeletal: Normal range of motion.  Able to raise left arm overhead but slow and painful, able to externally and internally rotate. Full range of motion of the neck, does not aggravate the shoulder pain  Neurological: He is alert and oriented to person, place, and time.  Skin: Skin is warm and dry.  No cyanosis  Nursing  note and vitals reviewed.    UC Treatments / Results   Procedures Procedures (including critical care time) None today  Final Clinical Impressions(s) / UC Diagnoses   Final diagnoses:  Acute pain of left shoulder   Shoulder pain today is suggestive of strain to the rotator cuff.  This will heal but takes time.  Anti inflammatories like ibuprofen are helpful for pain/irritation in the muscle.  Ice for 5-10 minutes several times a day will also help this.  Limit painful activities.  Note for work, return to work Mon 4/9.  Followup with sports med or orthopedics if not improving as anticipated.    New Prescriptions Discharge Medication List as of 03/07/2017  5:02 PM    START taking these medications   Details  cyclobenzaprine (FLEXERIL) 10 MG tablet Take 1 tablet (10 mg total) by mouth 2 (two) times daily as needed for muscle spasms., Starting Sat 03/07/2017, Normal    !! ibuprofen (ADVIL,MOTRIN) 800 MG tablet Take 1 tablet (800 mg total) by mouth 3 (three) times daily., Starting Sat 03/07/2017, Normal     !! - Potential duplicate medications found. Please discuss with provider.       Eustace Moore, MD 03/08/17 [phone]

## 2017-03-07 NOTE — Discharge Instructions (Addendum)
Shoulder pain today is suggestive of strain to the rotator cuff.  This will heal but takes time.  Anti inflammatories like ibuprofen are helpful for pain/irritation in the muscle.  Ice for 5-10 minutes several times a day will also help this.  Limit painful activities.  Note for work, return to work Mon 4/9.  Followup with sports med or orthopedics if not improving as anticipated.

## 2019-02-10 IMAGING — CT CT ABD-PELV W/ CM
2 of 4 series · 11 of 46 positions shown, 12 images · IV contrast (Iodine)
Comparison: None.

CLINICAL DATA: Right lower quadrant pain, vomiting, and diarrhea.

EXAM:
CT ABDOMEN AND PELVIS WITH CONTRAST
TECHNIQUE: Multidetector CT imaging of the abdomen and pelvis was performed
using the standard protocol following bolus administration of
intravenous contrast.
CONTRAST:  100mL [id number] IOPAMIDOL ([id number]-[id number]) INJECTION 61%

[Series 201: routine, idose (2) · axial · 0.77mm/px · z∈[+242,+617]mm · 8 of 91 slices shown, 9 images]
[im 8/91  soft-tissue]
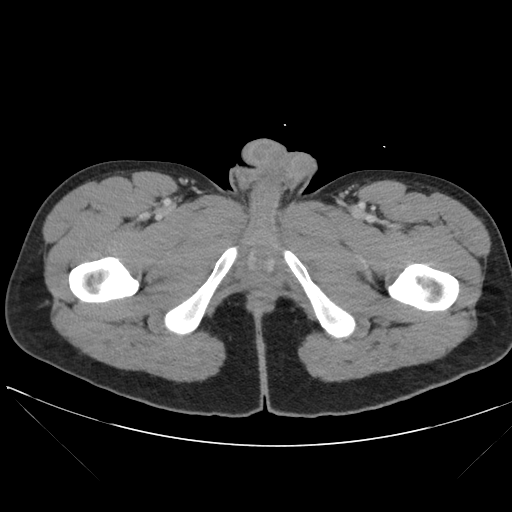
[im 8/91  bone]
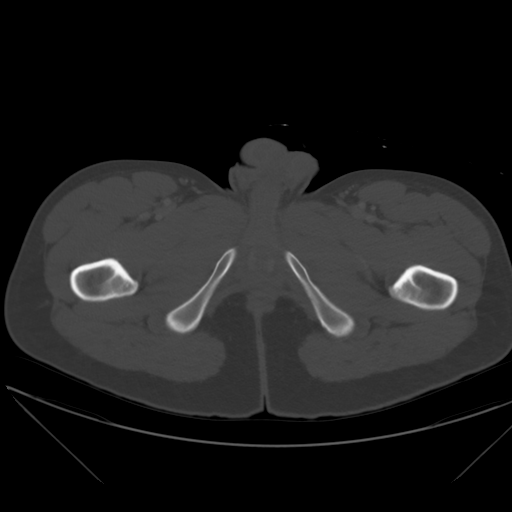
[im 19/91  soft-tissue]
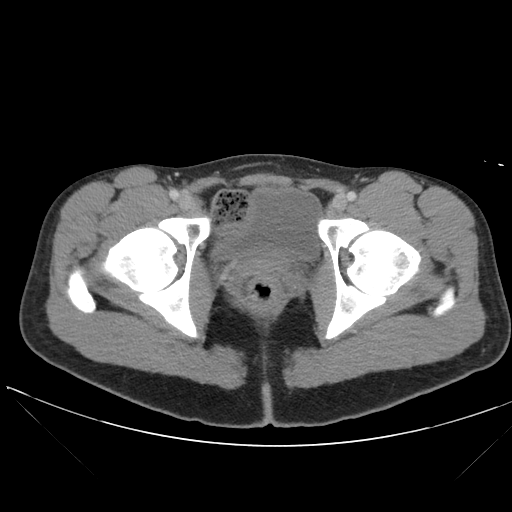
[im 31/91  soft-tissue]
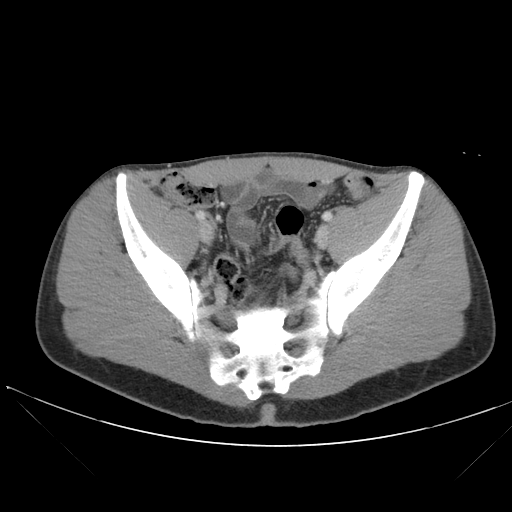
[im 42/91  soft-tissue]
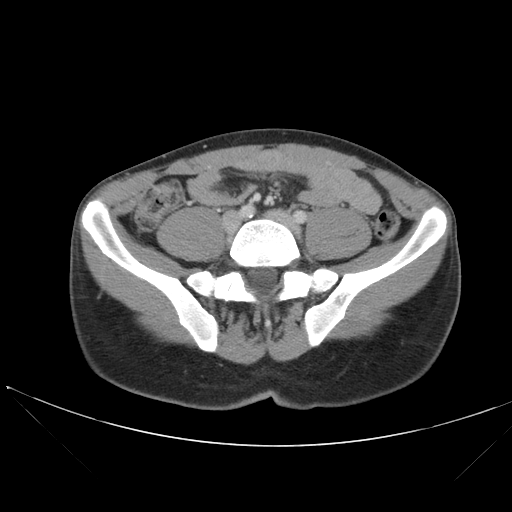
[im 49/91  soft-tissue]
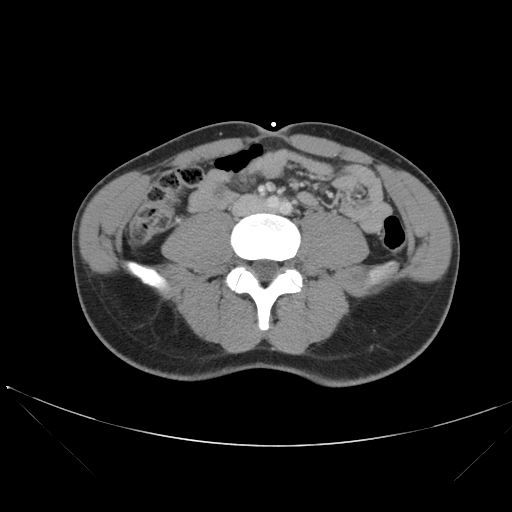
[im 61/91  soft-tissue]
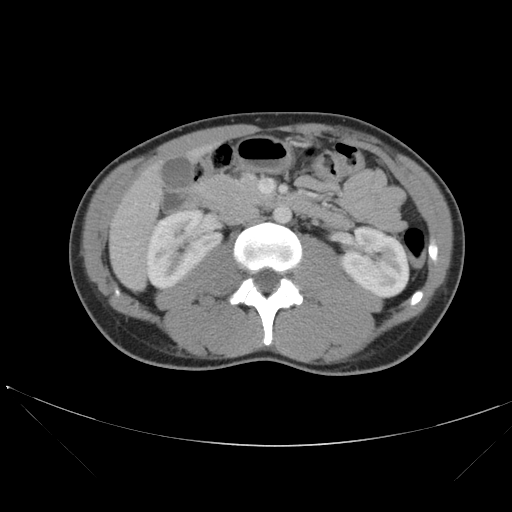
[im 72/91  soft-tissue]
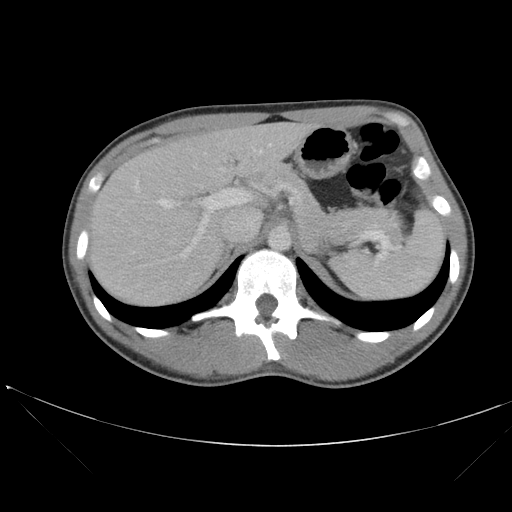
[im 83/91  soft-tissue]
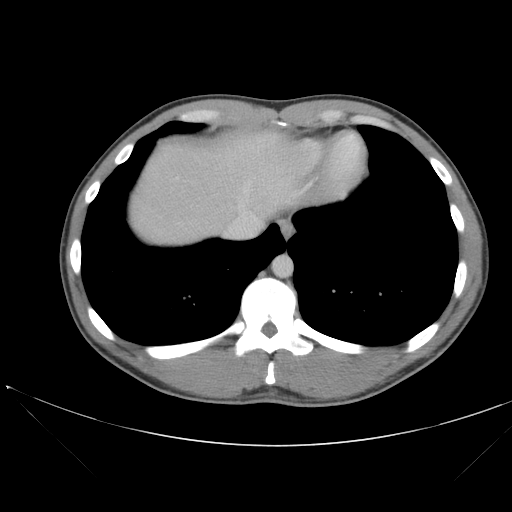

[Series 203: coronals, idose (2) · coronal · 0.45mm/px · 3 of 109 slices shown]
[im 37/109  soft-tissue]
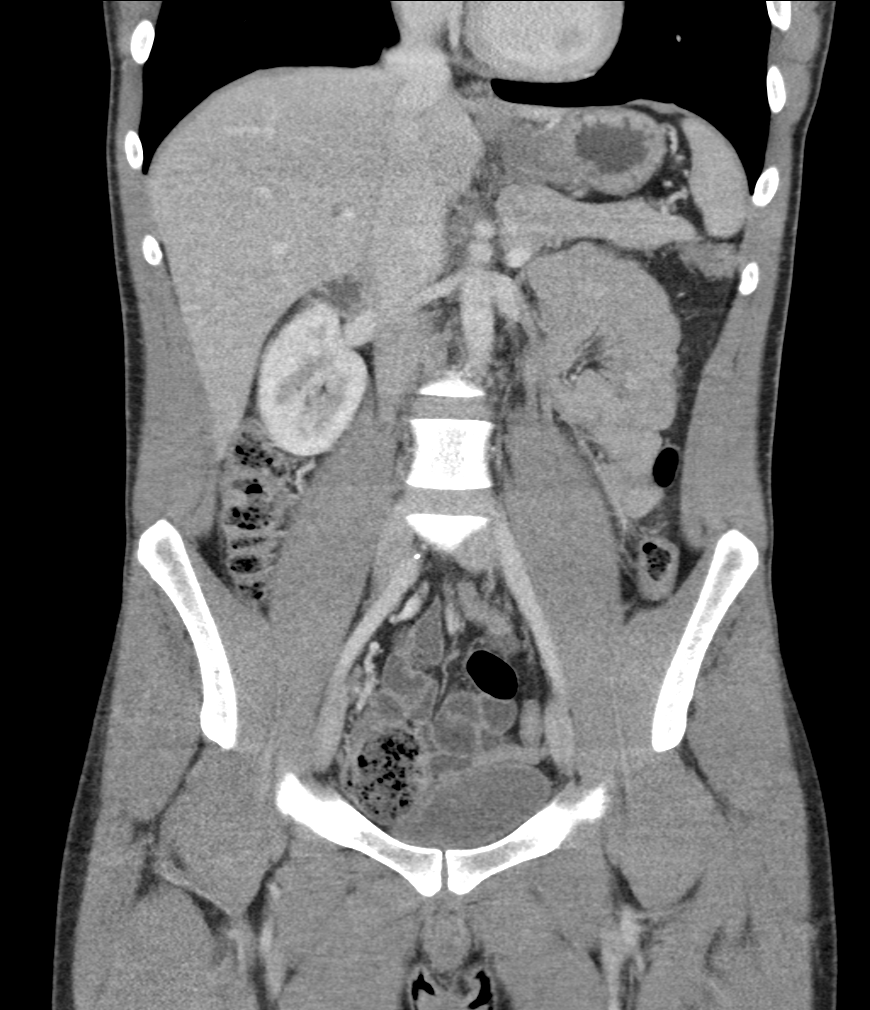
[im 49/109  soft-tissue]
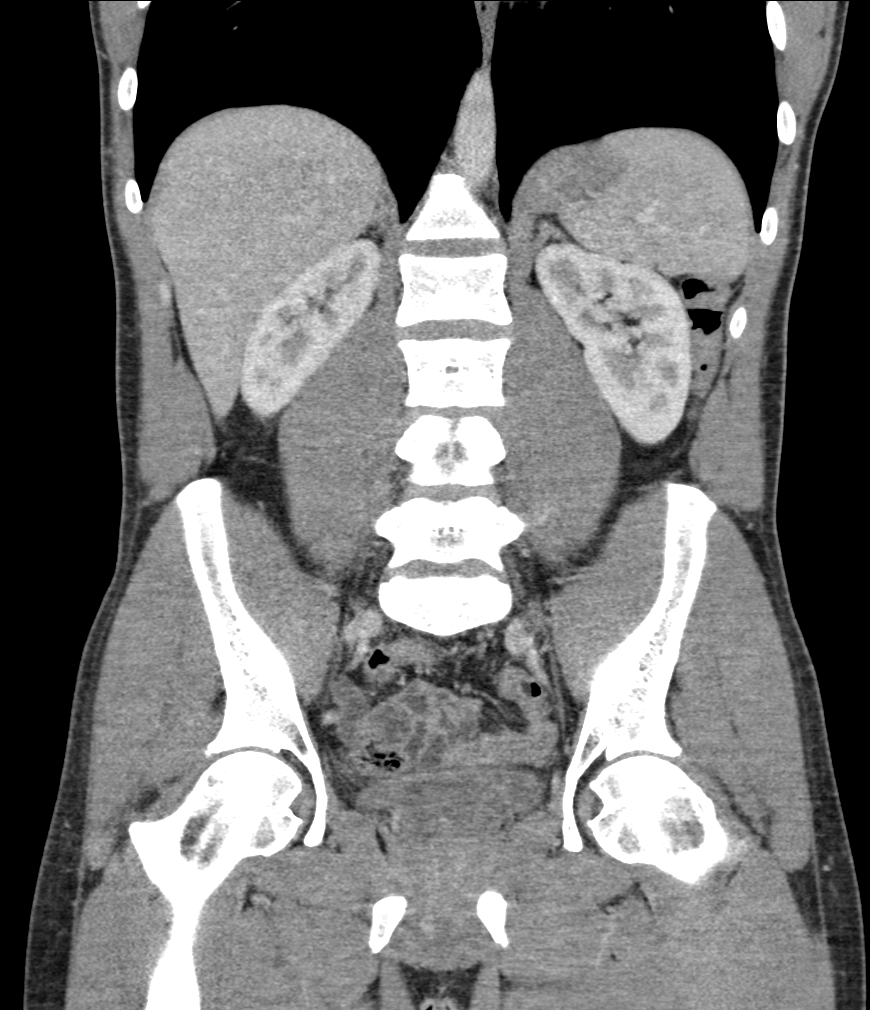
[im 61/109  soft-tissue]
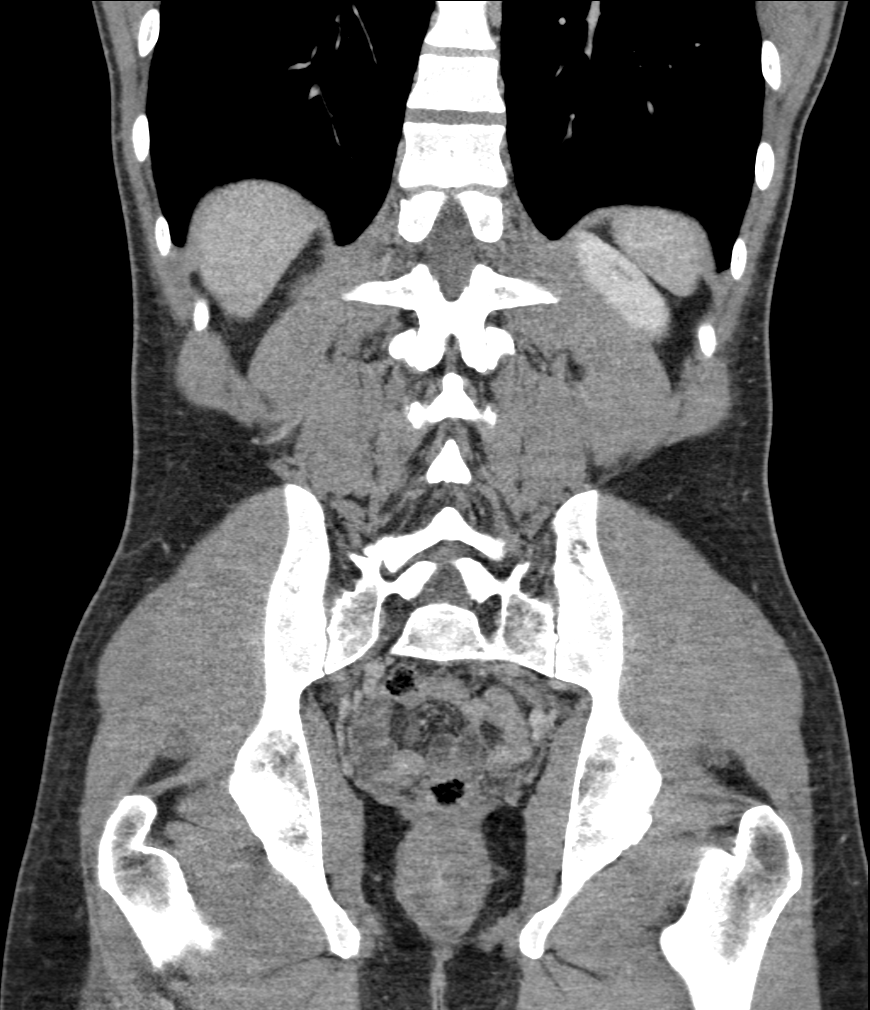

[11 of 46 positions shown; findings below may reference images not displayed]

FINDINGS: Lower chest: Lung bases are clear.

Hepatobiliary: No focal liver abnormality is seen. No gallstones,
gallbladder wall thickening, or biliary dilatation.

Pancreas: Unremarkable. No pancreatic ductal dilatation or
surrounding inflammatory changes.

Spleen: Normal in size without focal abnormality.

Adrenals/Urinary Tract: Adrenal glands are unremarkable. Kidneys are
normal, without renal calculi, focal lesion, or hydronephrosis.
Bladder is unremarkable.

Stomach/Bowel: Stomach is within normal limits. Appendix appears
normal. No evidence of bowel wall thickening, distention, or
inflammatory changes.

Vascular/Lymphatic: No significant vascular findings are present. No
enlarged abdominal or pelvic lymph nodes.

Reproductive: Prostate is unremarkable.

Other: No abdominal wall hernia or abnormality. No abdominopelvic
ascites.

Musculoskeletal: No acute or significant osseous findings.
IMPRESSION: No acute process demonstrated in the abdomen or pelvis. No evidence
of bowel obstruction or inflammation.

## 2019-04-06 ENCOUNTER — Emergency Department (HOSPITAL_COMMUNITY)
Admission: EM | Admit: 2019-04-06 | Discharge: 2019-04-06 | Disposition: A | Discharge status: Home / Self Care | Payer: Managed Care, Other (non HMO) | Attending: Emergency Medicine | Admitting: Emergency Medicine

## 2019-04-06 ENCOUNTER — Other Ambulatory Visit: Payer: Self-pay

## 2019-04-06 DIAGNOSIS — J45909 Unspecified asthma, uncomplicated: Secondary | ICD-10-CM | POA: Insufficient documentation

## 2019-04-06 DIAGNOSIS — Z79899 Other long term (current) drug therapy: Secondary | ICD-10-CM | POA: Insufficient documentation

## 2019-04-06 DIAGNOSIS — F17210 Nicotine dependence, cigarettes, uncomplicated: Secondary | ICD-10-CM | POA: Insufficient documentation

## 2019-04-06 DIAGNOSIS — Z21 Asymptomatic human immunodeficiency virus [HIV] infection status: Secondary | ICD-10-CM | POA: Insufficient documentation

## 2019-04-06 DIAGNOSIS — R569 Unspecified convulsions: Secondary | ICD-10-CM | POA: Diagnosis present

## 2019-04-06 LAB — COMPREHENSIVE METABOLIC PANEL
ALT: 18 U/L (ref 0–44)
AST: 20 U/L (ref 15–41)
Albumin: 4.1 g/dL (ref 3.5–5.0)
Alkaline Phosphatase: 56 U/L (ref 38–126)
Anion gap: 6 (ref 5–15)
BUN: 13 mg/dL (ref 6–20)
CO2: 26 mmol/L (ref 22–32)
Calcium: 9.3 mg/dL (ref 8.9–10.3)
Chloride: 107 mmol/L (ref 98–111)
Creatinine, Ser: 1.08 mg/dL (ref 0.61–1.24)
GFR calc Af Amer: >60 mL/min (ref >60)
GFR calc non Af Amer: >60 mL/min (ref >60)
Glucose, Bld: 88 mg/dL (ref 70–99)
Potassium: 4.2 mmol/L (ref 3.5–5.1)
Sodium: 139 mmol/L (ref 135–145)
Total Bilirubin: 0.7 mg/dL (ref 0.3–1.2)
Total Protein: 7.4 g/dL (ref 6.5–8.1)

## 2019-04-06 MED ORDER — LEVETIRACETAM IN NACL 500 MG/100ML IV SOLN
500.0000 mg | Freq: Once | INTRAVENOUS | Status: AC
  Administered 2019-04-06: 08:00:00 500 mg via INTRAVENOUS
  Filled 2019-04-06: qty 100

## 2019-04-06 MED ORDER — SODIUM CHLORIDE 0.9 % IV SOLN
INTRAVENOUS | Status: DC | PRN
  Administered 2019-04-06: 08:00:00 500 mL via INTRAVENOUS

## 2019-04-06 NOTE — ED Triage Notes (Signed)
Pt arrived via [facility] after having two witnessed one minute seizures this morning at work, coworkers reported that pt did not fall. Pt states he has been compliant with his medications, that he has not had a seizure in a few years. Coworkers describe a tonic clonic seizure, EMS states he was not postictal. Pt reports he has a headache and facial pain.

## 2019-04-06 NOTE — ED Notes (Signed)
Discharge paperwork reviewed with pt, pt did not have any questions or concerns at this time.

## 2019-04-06 NOTE — ED Notes (Signed)
Pt ambulatory at discharge.  

## 2019-04-06 NOTE — ED Provider Notes (Signed)
[facility]-EMERGENCY DEPT Provider Note   CSN: [id number] Arrival date & time: 04/06/19  7703    History   Chief Complaint Chief Complaint  Patient presents with  . Seizures    HPI Lee Austin is a 33 y.o. male.     33 year old with history of seizure disorder and HIV who presents after having tonic activity while at work.  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  Had 2 episodes where he had rhythmic jerking and staring off into space.  States he did not lose consciousness.  Takes Keppra for his seizures and states that he has been compliant.  No fever, headaches.  No upper or lower extremity weakness.  EMS was called and blood sugar was appropriate.  Patient was not postictal when they arrived.  Transported here without intervention     Past Medical History:  Diagnosis Date  . Asthma   . HIV (human immunodeficiency virus infection) (HCC)   . Seizures (HCC)     There are no active problems to display for this patient.   Past Surgical History:  Procedure Laterality Date  . KNEE SURGERY Left         Home Medications    Prior to Admission medications   Medication Sig Start Date End Date Taking? Authorizing Provider  abacavir-dolutegravir-lamiVUDine (TRIUMEQ) 600-50-300 MG tablet Take 1 tablet by mouth daily. 11/15/15   [provider]  albuterol (PROAIR HFA) 108 (90 Base) MCG/ACT inhaler Inhale 2 puffs into the lungs every 6 (six) hours as needed for shortness of breath.  10/09/15   [provider]  cyclobenzaprine (FLEXERIL) 10 MG tablet Take 1 tablet (10 mg total) by mouth 2 (two) times daily as needed for muscle spasms. 03/07/17   Isa Rankin, MD  fluticasone [facility]) 50 MCG/ACT nasal spray Place 1 spray into both nostrils daily. 05/14/16   Loren Racer, MD  gabapentin (NEURONTIN) 100 MG capsule Take 200 mg by mouth 3 (three) times daily.  07/31/15   [provider]  ibuprofen (ADVIL,MOTRIN) 600 MG tablet Take 1 tablet (600 mg total) by mouth every 6 (six) hours as needed. 09/17/16   Focht, Joyce Copa, PA  ibuprofen (ADVIL,MOTRIN) 800 MG tablet Take 1 tablet (800 mg total) by mouth 3 (three) times daily. 03/07/17   Isa Rankin, MD  levETIRAcetam (KEPPRA) 500 MG tablet Take 500 mg by mouth 2 (two) times daily. 07/31/15   [provider]  nortriptyline (PAMELOR) 50 MG capsule Take 100 mg by mouth at bedtime. 07/31/15   [provider]  ondansetron (ZOFRAN) 4 MG tablet Take 1 tablet (4 mg total) by mouth every 6 (six) hours. 01/14/17   Gilda Crease, MD    Family History No family history on file.  Social History Social History   Tobacco Use  . Smoking status: Current Some Day Smoker    Types: Cigarettes  . Smokeless tobacco: Never Used  Substance Use Topics  . Alcohol use: Yes    Comment: "special occasions"  . Drug use: Yes    Types: Marijuana    Comment: "whenever I can get it"     Allergies   Bee venom   Review of Systems Review of Systems  All other systems reviewed and are negative.    Physical Exam Updated Vital Signs BP 104/60 (BP Location: Right Arm)   Pulse (!) 55   Temp 98.3  F (36.8 C) (Oral)   Resp 17   Ht 1.854 m (6\' 1" )   Wt 72.6 kg   SpO2 100%   BMI 21.11 kg/m   Physical Exam Vitals signs and nursing note reviewed.  Constitutional:      General: He is not in acute distress.    Appearance: Normal appearance. He is well-developed. He is not toxic-appearing.  HENT:     Head: Normocephalic and atraumatic.  Eyes:     General: Lids are normal.     Conjunctiva/sclera: Conjunctivae normal.     Pupils: Pupils are equal, round, and reactive to light.  Neck:     Musculoskeletal: Normal range of motion and neck supple.     Thyroid: No thyroid mass.     Trachea: No tracheal deviation.  Cardiovascular:     Rate and Rhythm: Normal rate and regular  rhythm.     Heart sounds: Normal heart sounds. No murmur. No gallop.   Pulmonary:     Effort: Pulmonary effort is normal. No respiratory distress.     Breath sounds: Normal breath sounds. No stridor. No decreased breath sounds, wheezing, rhonchi or rales.  Abdominal:     General: Bowel sounds are normal. There is no distension.     Palpations: Abdomen is soft.     Tenderness: There is no abdominal tenderness. There is no rebound.  Musculoskeletal: Normal range of motion.        General: No tenderness.  Skin:    General: Skin is warm and dry.     Findings: No abrasion or rash.  Neurological:     Mental Status: He is alert and oriented to person, place, and time.     GCS: GCS eye subscore is 4. GCS verbal subscore is 5. GCS motor subscore is 6.     Cranial Nerves: No cranial nerve deficit.     Sensory: No sensory deficit.     Motor: No weakness or tremor.     Coordination: Romberg sign negative. Finger-Nose-Finger Test normal.     Comments: Strength 5/5 throughout  Psychiatric:        Speech: Speech normal.        Behavior: Behavior normal.      ED Treatments / Results  Labs (all labs ordered are listed, but only abnormal results are displayed) Labs Reviewed  COMPREHENSIVE METABOLIC PANEL    EKG None  Radiology No results found.  Procedures Procedures (including critical care time)  Medications Ordered in ED Medications  levETIRAcetam (KEPPRA) IVPB 500 mg/100 mL premix (has no administration in time range)     Initial Impression / Assessment and Plan / ED Course  I have reviewed the triage vital signs and the nursing notes.  Pertinent labs & imaging results that were available during my care of the patient were reviewed by me and considered in my medical decision making (see chart for details).        Patient given Keppra 500 mg IV piggyback.  Electrolytes within normal limits.  Suspect cause of patient's seizure could be from lack of rest.  Was monitored  here and no seizure activity.  Started to follow-up with his doctor.  Final Clinical Impressions(s) / ED Diagnoses   Final diagnoses:  None    ED Discharge Orders    None       Lorre NickAllen, Varian Innes, MD 04/06/19 [phone]

## 2022-05-17 ENCOUNTER — Encounter (HOSPITAL_COMMUNITY): Payer: Self-pay | Admitting: Emergency Medicine

## 2022-05-17 ENCOUNTER — Emergency Department (HOSPITAL_COMMUNITY)
Admission: EM | Admit: 2022-05-17 | Discharge: 2022-05-18 | Disposition: A | Discharge status: Home / Self Care | Payer: PRIVATE HEALTH INSURANCE | Attending: Emergency Medicine | Admitting: Emergency Medicine

## 2022-05-17 ENCOUNTER — Emergency Department (HOSPITAL_COMMUNITY): Payer: PRIVATE HEALTH INSURANCE

## 2022-05-17 DIAGNOSIS — R42 Dizziness and giddiness: Secondary | ICD-10-CM | POA: Insufficient documentation

## 2022-05-17 DIAGNOSIS — R5383 Other fatigue: Secondary | ICD-10-CM | POA: Insufficient documentation

## 2022-05-17 DIAGNOSIS — Z20822 Contact with and (suspected) exposure to covid-19: Secondary | ICD-10-CM | POA: Insufficient documentation

## 2022-05-17 DIAGNOSIS — R519 Headache, unspecified: Secondary | ICD-10-CM | POA: Insufficient documentation

## 2022-05-17 DIAGNOSIS — R569 Unspecified convulsions: Secondary | ICD-10-CM | POA: Insufficient documentation

## 2022-05-17 DIAGNOSIS — Z21 Asymptomatic human immunodeficiency virus [HIV] infection status: Secondary | ICD-10-CM | POA: Insufficient documentation

## 2022-05-17 DIAGNOSIS — J45909 Unspecified asthma, uncomplicated: Secondary | ICD-10-CM | POA: Insufficient documentation

## 2022-05-17 DIAGNOSIS — G40919 Epilepsy, unspecified, intractable, without status epilepticus: Secondary | ICD-10-CM

## 2022-05-17 LAB — COMPREHENSIVE METABOLIC PANEL
ALT: 14 U/L (ref 0–44)
AST: 18 U/L (ref 15–41)
Albumin: 4.4 g/dL (ref 3.5–5.0)
Alkaline Phosphatase: 56 U/L (ref 38–126)
Anion gap: 6 (ref 5–15)
BUN: 8 mg/dL (ref 6–20)
CO2: 29 mmol/L (ref 22–32)
Calcium: 9.9 mg/dL (ref 8.9–10.3)
Chloride: 109 mmol/L (ref 98–111)
Creatinine, Ser: 1.24 mg/dL (ref 0.61–1.24)
GFR, Estimated: >60 mL/min (ref >60)
Glucose, Bld: 102 mg/dL — ABNORMAL HIGH (ref 70–99)
Potassium: 4.2 mmol/L (ref 3.5–5.1)
Sodium: 144 mmol/L (ref 135–145)
Total Bilirubin: 1.2 mg/dL (ref 0.3–1.2)
Total Protein: 7.9 g/dL (ref 6.5–8.1)

## 2022-05-17 LAB — CBC
HCT: 43.9 % (ref 39.0–52.0)
Hemoglobin: 14.7 g/dL (ref 13.0–17.0)
MCH: 33.3 pg (ref 26.0–34.0)
MCHC: 33.5 g/dL (ref 30.0–36.0)
MCV: 99.3 fL (ref 80.0–100.0)
Platelets: 198 10*3/uL (ref 150–400)
RBC: 4.42 MIL/uL (ref 4.22–5.81)
RDW: 12.4 % (ref 11.5–15.5)
WBC: 6.1 10*3/uL (ref 4.0–10.5)
nRBC: 0 % (ref 0.0–0.2)

## 2022-05-17 LAB — TROPONIN I (HIGH SENSITIVITY): Troponin I (High Sensitivity): 4 ng/L (ref <18)

## 2022-05-17 MED ORDER — SODIUM CHLORIDE 0.9 % IV BOLUS
1000.0000 mL | Freq: Once | INTRAVENOUS | Status: AC
  Administered 2022-05-17: 1000 mL via INTRAVENOUS

## 2022-05-17 MED ORDER — PROCHLORPERAZINE EDISYLATE 10 MG/2ML IJ SOLN
10.0000 mg | Freq: Once | INTRAMUSCULAR | Status: AC
  Administered 2022-05-17: 10 mg via INTRAVENOUS
  Filled 2022-05-17: qty 2

## 2022-05-17 NOTE — ED Triage Notes (Signed)
Per EMS, patient from work, c/o dizziness, headache, and generalized weakness since 1900.   BP 114/78 HR 60 CBG 116  18g L AC

## 2022-05-17 NOTE — ED Notes (Signed)
Transported to CT 

## 2022-05-17 NOTE — ED Provider Notes (Signed)
Emergency Department Provider Note   I have reviewed the triage vital signs and the nursing notes.   HISTORY  Chief Complaint Dizziness and Headache   HPI Lee Austin is a 36 y.o. male with past medical history reviewed below presents emergency department for evaluation of fatigue feeling today with dizziness, headache.  Patient has history of seizure and did have a brief, witnessed, breakthrough seizure today.  The episode lasted for less than 1 minute with minimal postictal phase.  Patient has a migraine type headache which is typical for him after his seizures.  His last breakthrough seizure was January.  He is compliant with his Keppra 750 mg twice daily but due to being in the ED this evening has not taken his evening dose of medication.  Denies any fevers or chills.  No chest pain or abdominal discomfort.  No vomiting or diarrhea.  No sick contacts.   Past Medical History:  Diagnosis Date   Asthma    HIV (human immunodeficiency virus infection) (HCC)    Seizures (HCC)     Review of Systems  Constitutional: No fever/chills. Positive fatigue and lightheadedness.  Eyes: No visual changes. ENT: No sore throat. Cardiovascular: Denies chest pain. Respiratory: Denies shortness of breath. Gastrointestinal: No abdominal pain.  No nausea, no vomiting.  No diarrhea.  No constipation. Genitourinary: Negative for dysuria. Musculoskeletal: Negative for back pain. Skin: Negative for rash. Neurological: Negative for focal weakness or numbness. Positive HA.    ____________________________________________   PHYSICAL EXAM:  VITAL SIGNS: ED Triage Vitals  Enc Vitals Group     BP 05/17/22 2044 112/79     Pulse Rate 05/17/22 2044 63     Resp 05/17/22 2044 18     Temp 05/17/22 2044 98.4 F (36.9 C)     Temp Source 05/17/22 2044 Oral     SpO2 05/17/22 2044 100 %     Weight 05/17/22 2044 160 lb (72.6 kg)     Height 05/17/22 2044 6\' 1"  (1.854 m)   Constitutional: Alert and  oriented. Well appearing and in no acute distress. Eyes: Conjunctivae are normal. Head: Atraumatic. Nose: No congestion/rhinnorhea. Mouth/Throat: Mucous membranes are moist.  Neck: No stridor.   Cardiovascular: Normal rate, regular rhythm. Good peripheral circulation. Grossly normal heart sounds.   Respiratory: Normal respiratory effort.  No retractions. Lungs CTAB. Gastrointestinal: Soft and nontender. No distention.  Musculoskeletal: No lower extremity tenderness nor edema. No gross deformities of extremities. Neurologic:  Normal speech and language. No gross focal neurologic deficits are appreciated.  Skin:  Skin is warm, dry and intact. No rash noted.  ____________________________________________   LABS (all labs ordered are listed, but only abnormal results are displayed)  Labs Reviewed  COMPREHENSIVE METABOLIC PANEL - Abnormal; Notable for the following components:      Result Value   Glucose, Bld 102 (*)    All other components within normal limits  RESP PANEL BY RT-PCR (FLU A&B, COVID) ARPGX2  CBC  LEVETIRACETAM LEVEL  TROPONIN I (HIGH SENSITIVITY)   ____________________________________________  EKG   EKG Interpretation  Date/Time:  Saturday May 17 2022 22:54:47 EDT Ventricular Rate:  57 PR Interval:  168 QRS Duration: 96 QT Interval:  414 QTC Calculation: 404 R Axis:   88 Text Interpretation: Sinus arrhythmia Early repolarization Similar to prior Confirmed by [id number] [phone]) on 05/17/2022 11:02:57 PM        ____________________________________________  RADIOLOGY  CT Head Wo Contrast  Result Date: 05/18/2022 CLINICAL DATA:  Dizziness, headache and  mental status changes. Unknown etiology. EXAM: CT HEAD WITHOUT CONTRAST TECHNIQUE: Contiguous axial images were obtained from the base of the skull through the vertex without intravenous contrast. RADIATION DOSE REDUCTION: This exam was performed according to the departmental dose-optimization program which  includes automated exposure control, adjustment of the mA and/or kV according to patient size and/or use of iterative reconstruction technique. COMPARISON:  Head CT dated 05/12/2014 FINDINGS: Brain: No evidence of acute infarction, hemorrhage, hydrocephalus, extra-axial collection or mass lesion/mass effect. Vascular: No hyperdense vessel or unexpected calcification. Skull: No fracture or focal lesion is seen. There is chronic healed fracture deformity of the nasal bone. Sinuses/Orbits: There is scattered membrane thickening in the ethmoids, small retention cyst or polyp right maxillary sinus. Other visualized sinuses, bilateral mastoid air cells are clear. Other: None. IMPRESSION: 1. No acute intracranial CT findings or intracranial interval changes. 2. Sinus membrane disease. Electronically Signed   By: Almira Bar M.D.   On: 05/18/2022 00:19   DG Chest 2 View  Result Date: 05/17/2022 CLINICAL DATA:  syncope EXAM: CHEST - 2 VIEW COMPARISON:  Chest x-ray 10/27/2016 FINDINGS: The heart and mediastinal contours are within normal limits. Biapical pleural/pulmonary scarring. No focal consolidation. No pulmonary edema. No pleural effusion. No pneumothorax. No acute osseous abnormality. IMPRESSION: No active cardiopulmonary disease. Electronically Signed   By: Tish Frederickson M.D.   On: 05/17/2022 22:27    ____________________________________________   PROCEDURES  Procedure(s) performed:   Procedures  None  ____________________________________________   INITIAL IMPRESSION / ASSESSMENT AND PLAN / ED COURSE  Pertinent labs & imaging results that were available during my care of the patient were reviewed by me and considered in my medical decision making (see chart for details).   This patient is Presenting for Evaluation of headache, which does require a range of treatment options, and is a complaint that involves a high risk of morbidity and mortality.  The Differential Diagnoses includes but  is not exclusive to subarachnoid hemorrhage, meningitis, encephalitis, previous head trauma, cavernous venous thrombosis, muscle tension headache, glaucoma, temporal arteritis, migraine or migraine equivalent, etc.   Critical Interventions-    Medications  sodium chloride 0.9 % bolus 1,000 mL (0 mLs Intravenous Stopped 05/18/22 0037)  prochlorperazine (COMPAZINE) injection 10 mg (10 mg Intravenous Given 05/17/22 2332)    Reassessment after intervention: Symptoms improved.    I did obtain Additional Historical Information from family at bedside.  I decided to review pertinent External Data, and in summary last ED visit in our system was May 2020 for breakthrough seizure.   Clinical Laboratory Tests Ordered, included no leukocytosis or anemia.  No acute kidney injury.  Troponin is normal.  Radiologic Tests Ordered, included CT head. I independently interpreted the images and agree with radiology interpretation.   Cardiac Monitor Tracing which shows NSR without ectopy.    Social Determinants of Health Risk patient has a smoking history.   Medical Decision Making: Summary:  Patient presents emergency department for evaluation of headache, breakthrough seizure, fatigue today.  No focal neurodeficits on exam.  No fevers or findings on lab work or exam to strongly suspect underlying infectious etiology.  COVID/flu PCR testing is pending.  Given his lightheadedness as well as headache I do plan for CT imaging.  He is compliant with his medications we will give his home dose of Keppra PO at bedside.  Patient has his medication at bedside and prefers to take his own home medicines rather than ordering to the ED.  Reevaluation with update  and discussion with patient. HA resolved after treatment in the ED. CT head without acute findings. Will continue home keppra dose and follow with PCP and Neurology as an outpatient.   Considered admission patient's symptoms are improving.  CT imaging  unremarkable.  No acute lab abnormalities. Plan for close outpatient follow up.   Disposition: discharge  ____________________________________________  FINAL CLINICAL IMPRESSION(S) / ED DIAGNOSES  Final diagnoses:  Breakthrough seizure (HCC)  Acute nonintractable headache, unspecified headache type    Note:  This document was prepared using Dragon voice recognition software and may include unintentional dictation errors.  Alona Bene, MD, [facility] Emergency Medicine    Adonna Horsley, Arlyss Repress, MD 05/18/22 [phone]

## 2022-05-17 NOTE — ED Provider Triage Note (Signed)
Emergency Medicine Provider Triage Evaluation Note  Lee Austin , a 36 y.o. male  was evaluated in triage.  Pt complains of fatigue weakness and headache. States earlier today had some transient vertigo and has had some tremulousness and describes a possible syncopal event.  Hx of seizures. Keppra - 750 for the last couple months (because of a seizure in January).    Review of Systems  Positive: Syncope, weakness Negative: Fever   Physical Exam  BP 112/79 (BP Location: Left Arm)   Pulse 63   Temp 98.4 F (36.9 C) (Oral)   Resp 18   Ht 6\' 1"  (1.854 m)   Wt 72.6 kg   SpO2 100%   BMI 21.11 kg/m  Gen:   Awake, no distress   Resp:  Normal effort  MSK:   Moves extremities without difficulty  Other:  Smile symmetric moves all four extremities.   Medical Decision Making  Medically screening exam initiated at 9:28 PM.  Appropriate orders placed.  Lee Austin was informed that the remainder of the evaluation will be completed by another provider, this initial triage assessment does not replace that evaluation, and the importance of remaining in the ED until their evaluation is complete.  Labs.   Particia Jasper [place], DOLE 05/17/22 2130

## 2022-05-18 LAB — RESP PANEL BY RT-PCR (FLU A&B, COVID) ARPGX2
Influenza A by PCR: NEGATIVE
Influenza B by PCR: NEGATIVE
SARS Coronavirus 2 by RT PCR: NEGATIVE

## 2022-05-18 NOTE — ED Notes (Signed)
Pt reports that he no longer has a HA or feels dizzy. Ambulated to restroom independently.

## 2022-05-18 NOTE — ED Notes (Signed)
Patient verbalizes understanding of discharge instructions. Opportunity for questioning and answers were provided. Armband removed by staff, pt discharged from ED home with family. Ambulatory.

## 2022-05-18 NOTE — Discharge Instructions (Signed)

## 2022-05-19 LAB — LEVETIRACETAM LEVEL: Levetiracetam Lvl: 12.2 ug/mL (ref 10.0–40.0)

## 2024-06-12 IMAGING — CR DG CHEST 2V
2 series · 2 of 2 positions shown · non-contrast
Comparison: Chest x-ray 10/27/2016

CLINICAL DATA: syncope

EXAM:
CHEST - 2 VIEW

[w chest pa]
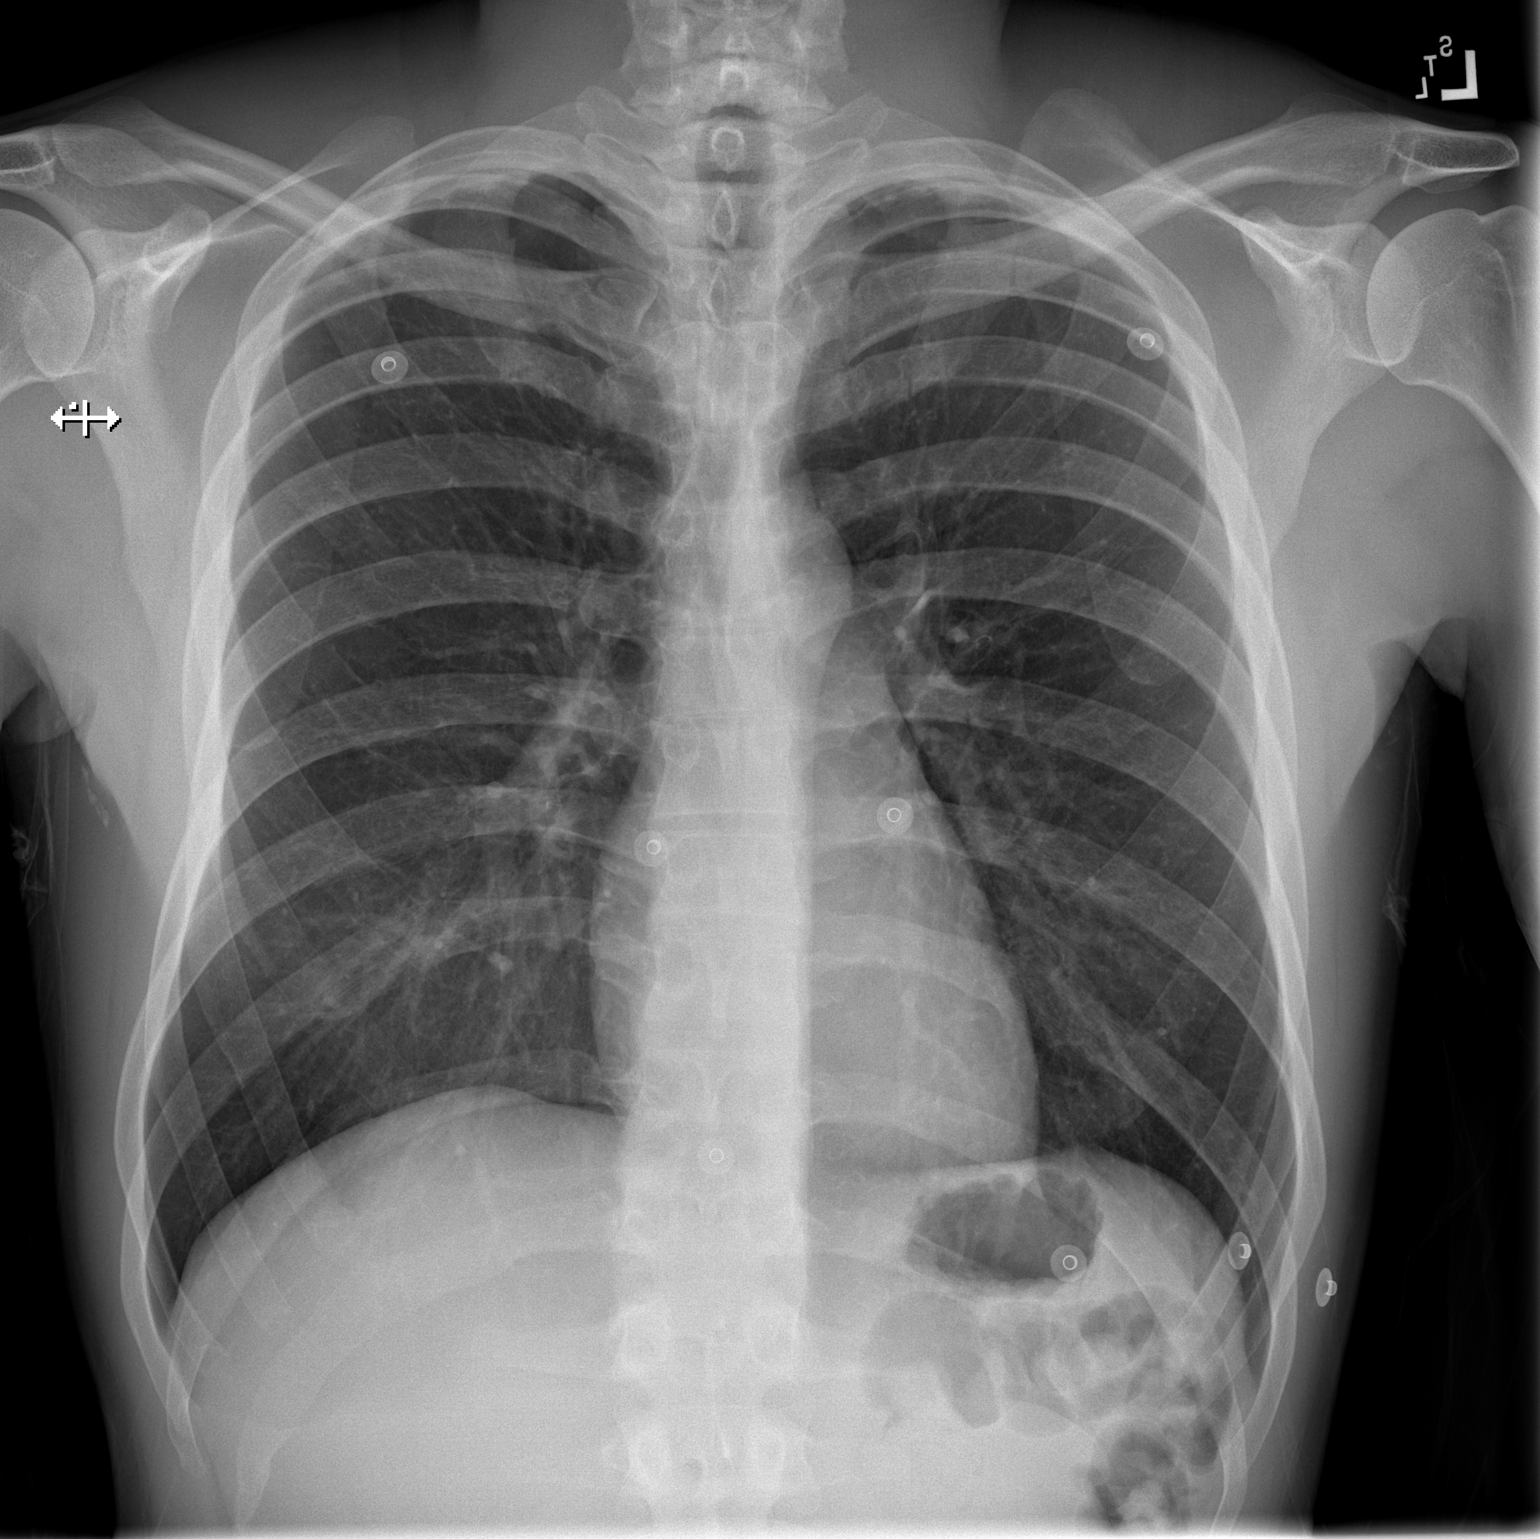

[w chest lat]
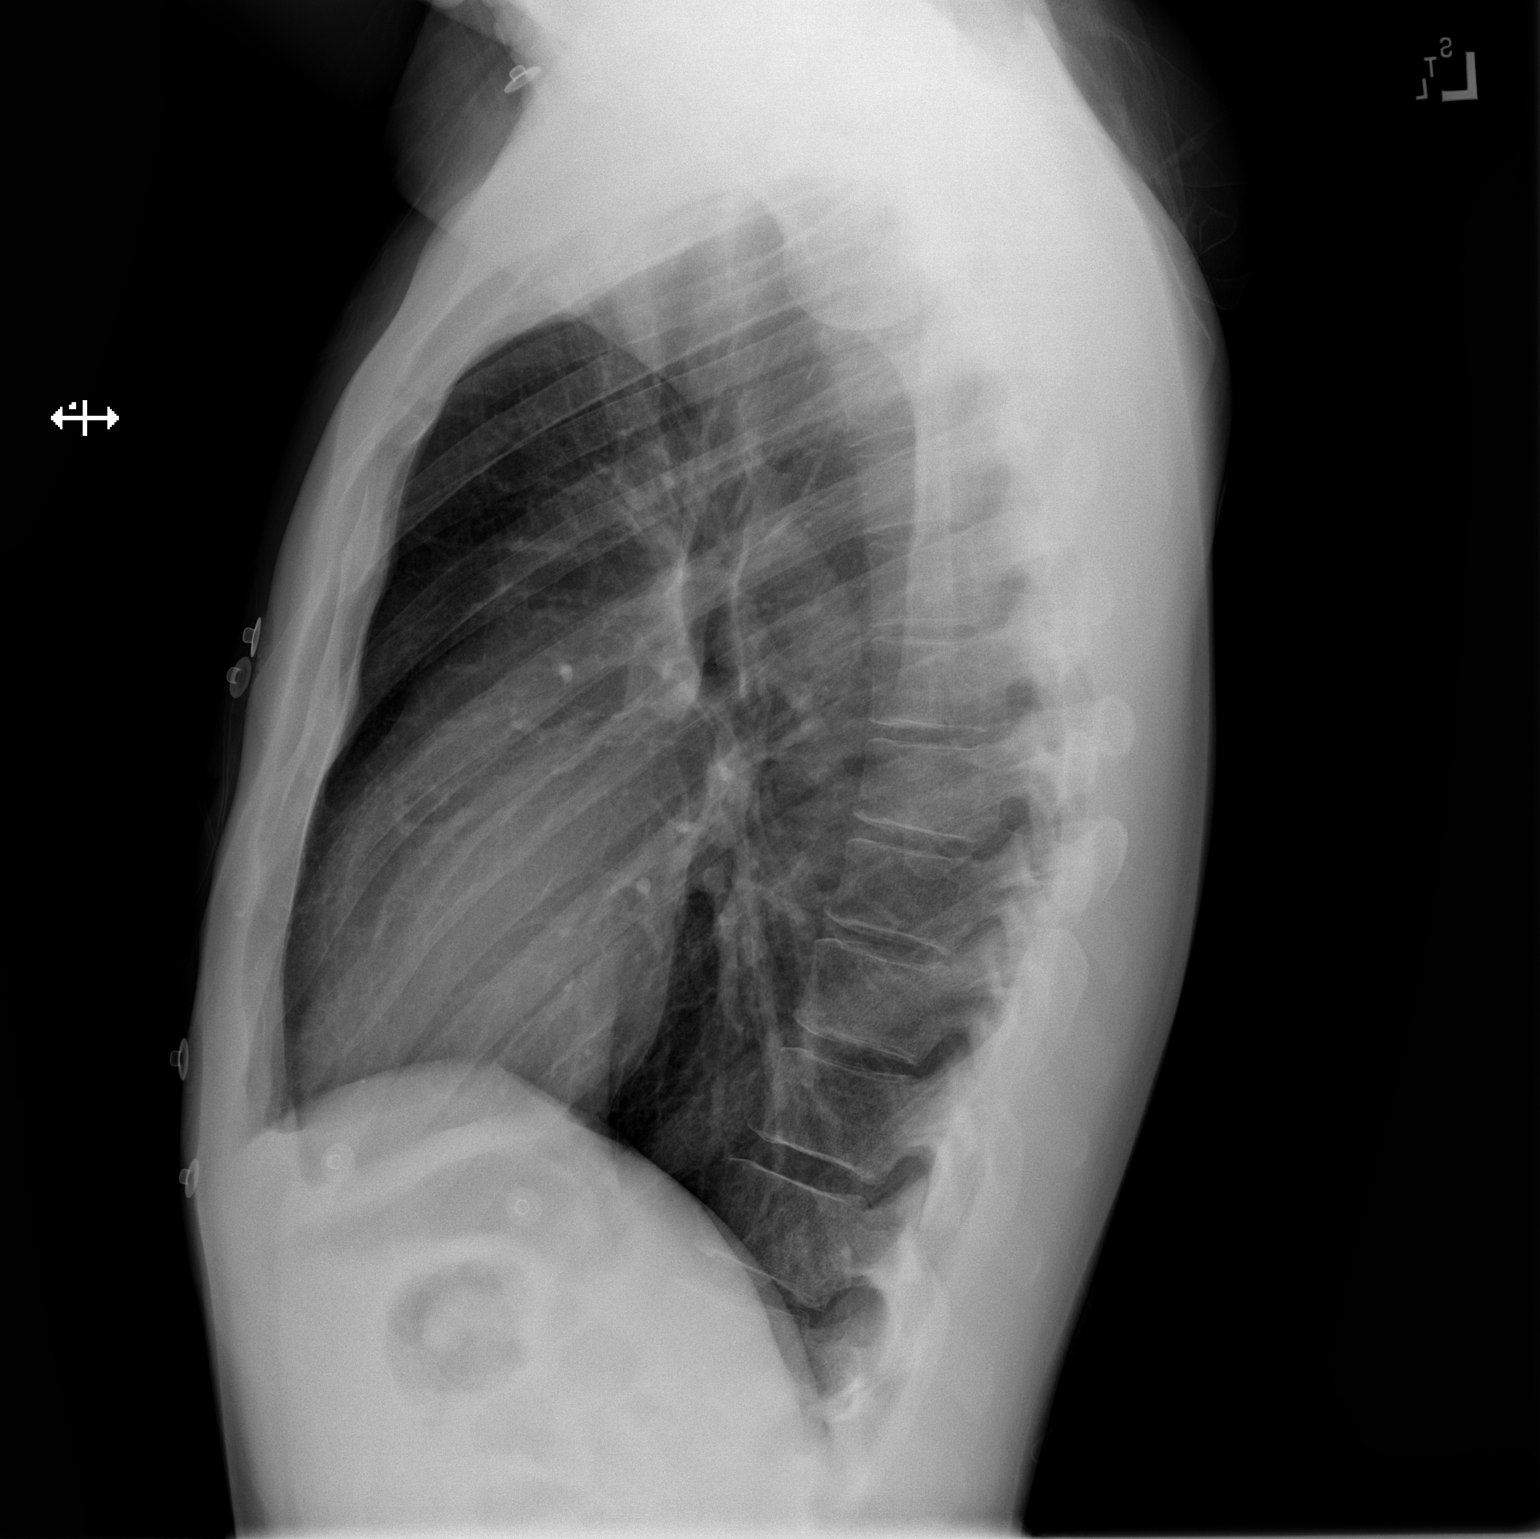

[2 of 2 positions shown; findings below may reference images not displayed]

FINDINGS: The heart and mediastinal contours are within normal limits.

Biapical pleural/pulmonary scarring. No focal consolidation. No
pulmonary edema. No pleural effusion. No pneumothorax.

No acute osseous abnormality.
IMPRESSION: No active cardiopulmonary disease.
